# Patient Record
Sex: Male | Born: 1950 | Race: White | Hispanic: No | Smoking: Former smoker
Health system: Southern US, Community
[De-identification: ages and names within clinical notes are randomized; demographics above are authoritative.]

## PROBLEM LIST (undated history)

## (undated) DIAGNOSIS — M1990 Unspecified osteoarthritis, unspecified site: Secondary | ICD-10-CM

## (undated) DIAGNOSIS — C4490 Unspecified malignant neoplasm of skin, unspecified: Secondary | ICD-10-CM

## (undated) DIAGNOSIS — E7800 Pure hypercholesterolemia, unspecified: Secondary | ICD-10-CM

## (undated) DIAGNOSIS — I219 Acute myocardial infarction, unspecified: Secondary | ICD-10-CM

## (undated) HISTORY — DX: Unspecified osteoarthritis, unspecified site: M19.90

## (undated) HISTORY — DX: Unspecified malignant neoplasm of skin, unspecified: C44.90

---

## 1999-05-20 ENCOUNTER — Encounter: Payer: Self-pay | Admitting: Emergency Medicine

## 1999-05-20 ENCOUNTER — Inpatient Hospital Stay (HOSPITAL_COMMUNITY): Admission: EM | Admit: 1999-05-20 | Discharge: 1999-05-31 | Payer: Self-pay | Admitting: Emergency Medicine

## 1999-05-21 ENCOUNTER — Encounter: Payer: Self-pay | Admitting: Cardiology

## 1999-05-22 ENCOUNTER — Encounter: Payer: Self-pay | Admitting: Cardiology

## 1999-05-23 ENCOUNTER — Encounter: Payer: Self-pay | Admitting: Cardiology

## 1999-05-27 ENCOUNTER — Encounter: Payer: Self-pay | Admitting: Cardiovascular Disease

## 1999-05-28 ENCOUNTER — Encounter: Payer: Self-pay | Admitting: Cardiology

## 1999-06-22 ENCOUNTER — Encounter (HOSPITAL_COMMUNITY): Admission: RE | Admit: 1999-06-22 | Discharge: 1999-09-20 | Payer: Self-pay | Admitting: Cardiology

## 1999-07-09 ENCOUNTER — Ambulatory Visit (HOSPITAL_COMMUNITY): Admission: RE | Admit: 1999-07-09 | Discharge: 1999-07-09 | Payer: Self-pay | Admitting: Cardiology

## 1999-09-20 DIAGNOSIS — I219 Acute myocardial infarction, unspecified: Secondary | ICD-10-CM

## 1999-09-20 HISTORY — PX: ANGIOPLASTY: SHX39

## 1999-09-20 HISTORY — DX: Acute myocardial infarction, unspecified: I21.9

## 2007-09-20 HISTORY — PX: KNEE ARTHROSCOPY: SUR90

## 2009-12-21 ENCOUNTER — Encounter: Admission: RE | Admit: 2009-12-21 | Discharge: 2009-12-21 | Payer: Self-pay | Admitting: Cardiology

## 2011-07-06 ENCOUNTER — Other Ambulatory Visit: Payer: Self-pay

## 2011-07-06 ENCOUNTER — Ambulatory Visit (INDEPENDENT_AMBULATORY_CARE_PROVIDER_SITE_OTHER): Payer: Managed Care, Other (non HMO) | Admitting: Vascular Surgery

## 2011-07-06 DIAGNOSIS — I6529 Occlusion and stenosis of unspecified carotid artery: Secondary | ICD-10-CM

## 2011-07-06 DIAGNOSIS — G458 Other transient cerebral ischemic attacks and related syndromes: Secondary | ICD-10-CM

## 2011-07-06 DIAGNOSIS — R0989 Other specified symptoms and signs involving the circulatory and respiratory systems: Secondary | ICD-10-CM

## 2011-07-11 NOTE — Procedures (Unsigned)
CAROTID DUPLEX EXAM  INDICATION:  Subclavian steal syndrome, diminished pulses.  HISTORY: Diabetes:  No. Cardiac:  MI, CAD, stent. Hypertension:  Yes. Smoking:  Currently. Previous Surgery:  No carotid intervention. CV History:  Asymptomatic. Amaurosis Fugax No, Paresthesias No, Hemiparesis No.                                      RIGHT             LEFT Brachial systolic pressure:         120               98 Brachial Doppler waveforms:         WNL               Monophasic Vertebral direction of flow:        Antegrade         Retrograde DUPLEX VELOCITIES (cm/sec) CCA peak systolic                   99                106 ECA peak systolic                   104               107 ICA peak systolic                   51                88 ICA end diastolic                   14                14 PLAQUE MORPHOLOGY:                  Heterogenous      Heterogenous PLAQUE AMOUNT:                      Mild              Mild PLAQUE LOCATION:                    CCA/ICA           CCA/ICA  IMPRESSION: 1. Bilateral internal carotid artery stenosis in the 1% to 39% range. 2. Left internal carotid artery is very tortuous in the mid segment. 3. Left vertebral artery is antegrade with monophasic subclavian     artery and a difference in blood pressures suggestive of subclavian     steal syndrome.  ___________________________________________ Quita Skye Hart Rochester, M.D.  SH/MEDQ  D:  07/06/2011  T:  07/06/2011  Job:  [id number]

## 2011-07-13 ENCOUNTER — Other Ambulatory Visit: Payer: Self-pay

## 2011-07-28 ENCOUNTER — Telehealth: Payer: Self-pay | Admitting: *Deleted

## 2011-07-28 NOTE — Telephone Encounter (Signed)
Dr. Geoffery Lyons office Silva Bandy [phone]) had called Okey Regal re: this patient's need to see Dr. Hart Rochester following his Carotid Duplex on 07/06/11. Dr. Hart Rochester reviewed this scan and said he did not need to see this patient if Mr. Ferencz was asymptomatic. I called Mr Fariss and he said he was having no problems and " wanted to know why we had cost him $500 to investigate nothing." I called Dr. Geoffery Lyons office back and informed them that this patient could just follow up with them because he was asymptomatic at this time ( Scan showed Left verterbral artery is antegrade with monophasic subclavian artery and a difference in blood pressures suggestive of subclavian steal syndrome). Dr. Duaine Dredge will follow this patient and refer him back if he has any need.

## 2011-09-09 ENCOUNTER — Emergency Department (HOSPITAL_COMMUNITY): Payer: Managed Care, Other (non HMO)

## 2011-09-09 ENCOUNTER — Emergency Department (HOSPITAL_COMMUNITY)
Admission: EM | Admit: 2011-09-09 | Discharge: 2011-09-09 | Disposition: A | Discharge status: Home / Self Care | Payer: Managed Care, Other (non HMO) | Attending: Emergency Medicine | Admitting: Emergency Medicine

## 2011-09-09 ENCOUNTER — Encounter: Payer: Self-pay | Admitting: *Deleted

## 2011-09-09 DIAGNOSIS — R51 Headache: Secondary | ICD-10-CM | POA: Insufficient documentation

## 2011-09-09 DIAGNOSIS — IMO0002 Reserved for concepts with insufficient information to code with codable children: Secondary | ICD-10-CM | POA: Insufficient documentation

## 2011-09-09 DIAGNOSIS — W19XXXA Unspecified fall, initial encounter: Secondary | ICD-10-CM

## 2011-09-09 DIAGNOSIS — S0181XA Laceration without foreign body of other part of head, initial encounter: Secondary | ICD-10-CM

## 2011-09-09 DIAGNOSIS — Z79899 Other long term (current) drug therapy: Secondary | ICD-10-CM | POA: Insufficient documentation

## 2011-09-09 DIAGNOSIS — S0180XA Unspecified open wound of other part of head, initial encounter: Secondary | ICD-10-CM | POA: Insufficient documentation

## 2011-09-09 DIAGNOSIS — S01409A Unspecified open wound of unspecified cheek and temporomandibular area, initial encounter: Secondary | ICD-10-CM | POA: Insufficient documentation

## 2011-09-09 DIAGNOSIS — I252 Old myocardial infarction: Secondary | ICD-10-CM | POA: Insufficient documentation

## 2011-09-09 DIAGNOSIS — W108XXA Fall (on) (from) other stairs and steps, initial encounter: Secondary | ICD-10-CM | POA: Insufficient documentation

## 2011-09-09 DIAGNOSIS — Z7982 Long term (current) use of aspirin: Secondary | ICD-10-CM | POA: Insufficient documentation

## 2011-09-09 DIAGNOSIS — S0003XA Contusion of scalp, initial encounter: Secondary | ICD-10-CM | POA: Insufficient documentation

## 2011-09-09 HISTORY — DX: Acute myocardial infarction, unspecified: I21.9

## 2011-09-09 MED ORDER — OXYCODONE-ACETAMINOPHEN 5-325 MG PO TABS: 1.0000 | ORAL_TABLET | Freq: Four times a day (QID) | ORAL | Status: AC | PRN

## 2011-09-09 NOTE — ED Notes (Signed)
Patient back from CT.

## 2011-09-09 NOTE — ED Notes (Signed)
Pt reports falling while walking down the stairs.  Denies LOC.  Pt noted to have 3 lacerations on his face.  No acute distress noted.  Bleeding controlled.
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
## 2012-01-03 ENCOUNTER — Other Ambulatory Visit: Payer: Self-pay | Admitting: Vascular Surgery

## 2012-03-02 ENCOUNTER — Ambulatory Visit: Payer: 59

## 2012-03-02 ENCOUNTER — Ambulatory Visit (INDEPENDENT_AMBULATORY_CARE_PROVIDER_SITE_OTHER): Payer: 59 | Admitting: Family Medicine

## 2012-03-02 VITALS — BP 108/68 | HR 83 | Temp 97.8°F | Resp 16

## 2012-03-02 DIAGNOSIS — S59909A Unspecified injury of unspecified elbow, initial encounter: Secondary | ICD-10-CM

## 2012-03-02 DIAGNOSIS — S6990XA Unspecified injury of unspecified wrist, hand and finger(s), initial encounter: Secondary | ICD-10-CM

## 2012-03-02 DIAGNOSIS — W19XXXA Unspecified fall, initial encounter: Secondary | ICD-10-CM

## 2012-03-02 DIAGNOSIS — M25529 Pain in unspecified elbow: Secondary | ICD-10-CM

## 2012-03-02 MED ORDER — HYDROCODONE-ACETAMINOPHEN 5-500 MG PO TABS: 1.0000 | ORAL_TABLET | Freq: Three times a day (TID) | ORAL | Status: AC | PRN

## 2012-03-02 MED ORDER — AMOXICILLIN-POT CLAVULANATE 875-125 MG PO TABS: 1.0000 | ORAL_TABLET | Freq: Two times a day (BID) | ORAL | Status: AC

## 2012-03-02 NOTE — Progress Notes (Signed)
Patient Name: Caleb Le Date of Birth: 25-Nov-1950 Medical Record Number: [id number] Gender: male Date of Encounter: 03/02/2012  History of Present Illness:  Caleb Le is a 61 y.o. very pleasant male patient who presents with the following:  Here today to evaluate injuries from a fall.  He was on a ladder and fell about 10 feet earlier today.  He hurt his right elbow, and scraped his left forearm.  He denies any headache or head/ neck injury, and states that he does not think he is hurt except for his elbow which has a laceration/ skin tear and tenderness.  He had no LOC, nausea, vomiting or confusion.   Last tetanus shot was last year  Otherwise history of MI and tobacco abuse  There is no problem list on file for this patient.  Past Medical History  Diagnosis Date  . MI (myocardial infarction)    No past surgical history on file. History  Substance Use Topics  . Smoking status: Current Everyday Smoker  . Smokeless tobacco: Not on file  . Alcohol Use: Yes   No family history on file. No Known Allergies  Medication list has been reviewed and updated.  Prior to Admission medications   Medication Sig Start Date End Date Taking? Authorizing Provider  aspirin 81 MG chewable tablet Chew 81 mg by mouth daily.     Yes Historical Provider, MD  citalopram (CELEXA) 20 MG tablet Take 20 mg by mouth daily.     Yes Historical Provider, MD  metoprolol succinate (TOPROL-XL) 25 MG 24 hr tablet Take 25 mg by mouth daily.     Yes Historical Provider, MD  sertraline (ZOLOFT) 50 MG tablet Take 50 mg by mouth daily.      Historical Provider, MD  varenicline (CHANTIX) 1 MG tablet Take 1 mg by mouth 2 (two) times daily.      Historical Provider, MD    Review of Systems:  As per HPI- otherwise negative.  Physical Examination: Filed Vitals:   03/02/12 1518  BP: 108/68  Pulse: 83  Temp: 97.8 F (36.6 C)  Resp: 16   There were no vitals filed for this visit. There is  no height or weight on file to calculate BMI. Ideal Body Weight:    GEN: WDWN, NAD, Non-toxic, A & O x 3 HEENT: Atraumatic, Normocephalic. Neck supple. No masses, No LAD. PEERL, EOMI.  No tenderness with palpation of cervical spine, neck with full ROM.  TM and oropharynx wnl except evidence of smoking on tongue.  No "raccoon eyes," tenderness along facial bones or other sign of fracture.  He does have a wound on the left bridge of nose, but he states that this is chronic and he will see derm for possible skin cancer later this month.  Ears and Nose: No external deformity. CV: RRR, No M/G/R. No JVD. No thrill. No extra heart sounds. PULM: CTA B, no wheezes, crackles, rhonchi. No retractions. No resp. distress. No accessory muscle use. ABD: S, NT, ND EXTR: No c/c/e There is an abrasion over the left forearm, no evidence of fracture.  There is a small laceration in the web-space of the right hand.  Right elbow: minimal tenderness to ROM, there is a skin tear and a deeper defect in the soft tissue over the lateral elbow joint.  He is able to flex and extend the elbow fully, and to pronate and supinate.  NEURO Normal gait.  PSYCH: Normally interactive. Conversant. Not depressed or  anxious appearing.    [facility] reading (PRIMARY) by  Dr. Patsy Lager. Right elbow:  No fracture.  Small calcification anteriorly in soft tissue, but the wound is posterior/ lateral C- spine: negative except for degenative change  CERVICAL SPINE - COMPLETE 4+ VIEW  Comparison: No priors.  Findings: Multiple views of the cervical spine demonstrate no acute  displaced cervical spine fracture. Alignment is anatomic.  Prevertebral soft tissues are normal. There is mild multilevel  degenerative disc disease, most severe at C6-C7, and C7-T1. Very  mild multilevel facet arthropathy is also noted. Visualized  portions of the upper thorax are remarkable for atherosclerotic  calcifications within the arch of the aorta.  IMPRESSION:    1. No acute radiographic abnormality of the cervical spine.  2. Mild multilevel degenerative disc disease and cervical  spondylosis, as above.  3. Atherosclerosis.   RIGHT ELBOW - COMPLETE 3+ VIEW  Comparison: No priors.  Findings: Multiple views of the right elbow demonstrate no acute  fracture, subluxation, dislocation, joint or soft tissue  abnormality.  IMPRESSION:  1. No acute radiographic abnormality of the right elbow.  Assessment and Plan: 1. Fall  DG Cervical Spine Complete  2. Pain, elbow  DG Elbow Complete Right  3. Injury of elbow  amoxicillin-clavulanate (AUGMENTIN) 875-125 MG per tablet, HYDROcodone-acetaminophen (VICODIN) 5-500 MG per tablet   elbow and hand wound repaired as per note by Kennedy Bucker, PA- C.   Will cover with augmentin and give some vicodin to use for pain.  Cautioned re: signs and symptoms of infection.  He is instructed to RTC for a wound check in 2 or 3 days, and also cautioned regarding signs of other more serious injury including HA, N/V, confusion.    Abbe Amsterdam, MD

## 2012-03-02 NOTE — Progress Notes (Signed)
Procedure:  VCO  Right Elbow Wound  Lidocaine 2% plain injected locally 4 cc. Sterile field and prep.  Retracted superficial flapped skin debrided.   Wound explored.  Muscle visualized without deficit. No FB noted. Wound irrigated with 20 cc sterile NACL. Wound closed with # 2 Sub Q, 4.0 Vicryl and # 2 HM 5.0 Ethilon Wound cleansed and dressed.  Right hand, 1st/2nd webspace  Lidocaine 2% plain, injected locally 3 cc. Sterile field and prep. Explored and not deep. Closed # 2 HM, 5.0 Ethilon. Wound cleansed and dressed.

## 2012-03-02 NOTE — Patient Instructions (Addendum)

## 2012-03-12 ENCOUNTER — Other Ambulatory Visit: Payer: Self-pay | Admitting: Family Medicine

## 2012-03-12 ENCOUNTER — Ambulatory Visit
Admission: RE | Admit: 2012-03-12 | Discharge: 2012-03-12 | Disposition: A | Discharge status: Home / Self Care | Payer: 59 | Source: Ambulatory Visit | Attending: Family Medicine | Admitting: Family Medicine

## 2012-03-12 DIAGNOSIS — R609 Edema, unspecified: Secondary | ICD-10-CM

## 2012-03-12 DIAGNOSIS — R52 Pain, unspecified: Secondary | ICD-10-CM

## 2012-06-13 ENCOUNTER — Encounter (HOSPITAL_BASED_OUTPATIENT_CLINIC_OR_DEPARTMENT_OTHER): Payer: Self-pay | Admitting: Emergency Medicine

## 2012-06-13 ENCOUNTER — Emergency Department (HOSPITAL_BASED_OUTPATIENT_CLINIC_OR_DEPARTMENT_OTHER): Payer: 59

## 2012-06-13 ENCOUNTER — Emergency Department (HOSPITAL_BASED_OUTPATIENT_CLINIC_OR_DEPARTMENT_OTHER)
Admission: EM | Admit: 2012-06-13 | Discharge: 2012-06-13 | Disposition: A | Discharge status: Home / Self Care | Payer: 59 | Attending: Emergency Medicine | Admitting: Emergency Medicine

## 2012-06-13 DIAGNOSIS — I252 Old myocardial infarction: Secondary | ICD-10-CM | POA: Insufficient documentation

## 2012-06-13 DIAGNOSIS — R51 Headache: Secondary | ICD-10-CM | POA: Insufficient documentation

## 2012-06-13 DIAGNOSIS — S0181XA Laceration without foreign body of other part of head, initial encounter: Secondary | ICD-10-CM

## 2012-06-13 DIAGNOSIS — S0180XA Unspecified open wound of other part of head, initial encounter: Secondary | ICD-10-CM | POA: Insufficient documentation

## 2012-06-13 DIAGNOSIS — R079 Chest pain, unspecified: Secondary | ICD-10-CM | POA: Insufficient documentation

## 2012-06-13 DIAGNOSIS — M47812 Spondylosis without myelopathy or radiculopathy, cervical region: Secondary | ICD-10-CM | POA: Insufficient documentation

## 2012-06-13 DIAGNOSIS — H05239 Hemorrhage of unspecified orbit: Secondary | ICD-10-CM | POA: Insufficient documentation

## 2012-06-13 DIAGNOSIS — M25559 Pain in unspecified hip: Secondary | ICD-10-CM | POA: Insufficient documentation

## 2012-06-13 HISTORY — DX: Pure hypercholesterolemia, unspecified: E78.00

## 2012-06-13 MED ORDER — LIDOCAINE-EPINEPHRINE 2 %-1:100000 IJ SOLN
INTRAMUSCULAR | Status: AC
  Filled 2012-06-13: qty 1
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
## 2012-06-13 NOTE — ED Notes (Signed)
Pt intoxicated upon arrival, reports drinking large quantities of alcholol tonight

## 2012-09-19 DIAGNOSIS — C4490 Unspecified malignant neoplasm of skin, unspecified: Secondary | ICD-10-CM

## 2012-09-19 HISTORY — DX: Unspecified malignant neoplasm of skin, unspecified: C44.90

## 2012-09-19 HISTORY — PX: OTHER SURGICAL HISTORY: SHX169

## 2013-02-01 ENCOUNTER — Other Ambulatory Visit (INDEPENDENT_AMBULATORY_CARE_PROVIDER_SITE_OTHER): Payer: 59 | Admitting: Vascular Surgery

## 2013-02-01 ENCOUNTER — Encounter: Payer: Self-pay | Admitting: Internal Medicine

## 2013-02-01 DIAGNOSIS — I6529 Occlusion and stenosis of unspecified carotid artery: Secondary | ICD-10-CM

## 2013-03-21 ENCOUNTER — Ambulatory Visit (AMBULATORY_SURGERY_CENTER): Payer: 59 | Admitting: *Deleted

## 2013-03-21 VITALS — Ht 67.0 in | Wt 164.8 lb

## 2013-03-21 DIAGNOSIS — Z1211 Encounter for screening for malignant neoplasm of colon: Secondary | ICD-10-CM

## 2013-03-21 MED ORDER — MOVIPREP 100 G PO SOLR: ORAL | Status: DC

## 2013-04-04 ENCOUNTER — Encounter: Payer: Self-pay | Admitting: Internal Medicine

## 2013-04-04 ENCOUNTER — Ambulatory Visit (AMBULATORY_SURGERY_CENTER): Payer: 59 | Admitting: Internal Medicine

## 2013-04-04 VITALS — BP 106/64 | HR 68 | Temp 97.5°F | Resp 22 | Ht 67.0 in | Wt 164.0 lb

## 2013-04-04 DIAGNOSIS — Z1211 Encounter for screening for malignant neoplasm of colon: Secondary | ICD-10-CM

## 2013-04-04 DIAGNOSIS — D126 Benign neoplasm of colon, unspecified: Secondary | ICD-10-CM

## 2013-04-04 MED ORDER — SODIUM CHLORIDE 0.9 % IV SOLN: 500.0000 mL | INTRAVENOUS | Status: DC

## 2013-04-04 NOTE — Progress Notes (Signed)
Lidocaine-40mg IV prior to Propofol InductionPropofol given over incremental dosages 

## 2013-04-04 NOTE — Op Note (Signed)
[facility] [address] [place], [address]   COLONOSCOPY PROCEDURE REPORT  PATIENT: Caleb Le, Caleb Le  MR#: [id number] BIRTHDATE: 04-11-1951 , 61  yrs. old GENDER: Male ENDOSCOPIST: Beverley Fiedler, MD REFERRED VH:QIONG Duaine Dredge, M.D. PROCEDURE DATE:  04/04/2013 PROCEDURE:   Colonoscopy with snare polypectomy ASA CLASS:   Class II INDICATIONS:average risk screening and first colonoscopy (pt report 1st colonoscopy, reported hx of polyps felt to be bladder polyps).  MEDICATIONS: MAC sedation, administered by CRNA and propofol (Diprivan) 450mg  IV  DESCRIPTION OF PROCEDURE:   After the risks benefits and alternatives of the procedure were thoroughly explained, informed consent was obtained.  A digital rectal exam revealed no rectal mass.   The LB [id number]  endoscope was introduced through the anus and advanced to the cecum, which was identified by both the appendix and ileocecal valve. No adverse events experienced. The quality of the prep was good, using MoviPrep  The instrument was then slowly withdrawn as the colon was fully examined.  COLON FINDINGS: A sessile polyp measuring 5 mm in size was found at the cecum.  A polypectomy was performed with a cold snare.  The resection was complete and the polyp tissue was completely retrieved.   A sessile polyp measuring 7 mm in size with a mucous cap was found in the transverse colon.  A polypectomy was performed using snare cautery.  The resection was complete and the polyp tissue was completely retrieved.   Two sessile polyps measuring 3 and 5 mm in size were found in the sigmoid colon and rectosigmoid colon.  Polypectomy was performed using cold snare.  All resections were complete and all polyp tissue was completely retrieved. Retroflexed views revealed internal hemorrhoids. The time to cecum=3 minutes 55 seconds.  Withdrawal time=21 minutes 15 seconds. The scope was withdrawn and the procedure  completed. COMPLICATIONS: There were no complications.  ENDOSCOPIC IMPRESSION: 1.   Sessile polyp measuring 5 mm in size was found at the cecum; polypectomy was performed with a cold snare 2.   Sessile polyp measuring 7 mm in size was found in the transverse colon; polypectomy was performed using snare cautery 3.   Two sessile polyps measuring 3 and 5 mm in size were found in the sigmoid colon and rectosigmoid colon; Polypectomy was performed using cold snare 4.   Small internal hemorrhoids  RECOMMENDATIONS: 1.  Hold aspirin, aspirin products, and anti-inflammatory medication for 2 weeks. 2.  Await pathology results 3.  If the polyps removed today are proven to be adenomatous (pre-cancerous) polyps, you will need a colonoscopy in 3 years. Otherwise you should continue to follow colorectal cancer screening guidelines for "routine risk" patients with a colonoscopy in 10 years.  You will receive a letter within 1-2 weeks with the results of your biopsy as well as final recommendations.  Please call my office if you have not received a letter after 3 weeks.   eSigned:  Beverley Fiedler, MD 04/04/2013 10:15 AM   cc: The Patient and Mosetta Putt, MD   PATIENT NAME:  Caleb Le, Caleb Le MR#: [id number]

## 2013-04-04 NOTE — Progress Notes (Signed)
Patient did not experience any of the following events: a burn prior to discharge; a fall within the facility; wrong site/side/patient/procedure/implant event; or a hospital transfer or hospital admission upon discharge from the facility. (G8907) Patient did not have preoperative order for IV antibiotic SSI prophylaxis. (G8918)  

## 2013-04-04 NOTE — Patient Instructions (Addendum)
YOU HAD AN ENDOSCOPIC PROCEDURE TODAY AT THE [facility]: Refer to the procedure report that was given to you for any specific questions about what was found during the examination.  If the procedure report does not answer your questions, please call your gastroenterologist to clarify.  If you requested that your care partner not be given the details of your procedure findings, then the procedure report has been included in a sealed envelope for you to review at your convenience later.  YOU SHOULD EXPECT: Some feelings of bloating in the abdomen. Passage of more gas than usual.  Walking can help get rid of the air that was put into your GI tract during the procedure and reduce the bloating. If you had a lower endoscopy (such as a colonoscopy or flexible sigmoidoscopy) you may notice spotting of blood in your stool or on the toilet paper. If you underwent a bowel prep for your procedure, then you may not have a normal bowel movement for a few days.  DIET: Your first meal following the procedure should be a light meal and then it is ok to progress to your normal diet.  A half-sandwich or bowl of soup is an example of a good first meal.  Heavy or fried foods are harder to digest and may make you feel nauseous or bloated.  Likewise meals heavy in dairy and vegetables can cause extra gas to form and this can also increase the bloating.  Drink plenty of fluids but you should avoid alcoholic beverages for 24 hours.  ACTIVITY: Your care partner should take you home directly after the procedure.  You should plan to take it easy, moving slowly for the rest of the day.  You can resume normal activity the day after the procedure however you should NOT DRIVE or use heavy machinery for 24 hours (because of the sedation medicines used during the test).    SYMPTOMS TO REPORT IMMEDIATELY: A gastroenterologist can be reached at any hour.  During normal business hours, 8:30 AM to 5:00 PM Monday through Friday,  call [phone].  After hours and on weekends, please call the GI answering service at [phone] who will take a message and have the physician on call contact you.   Following lower endoscopy (colonoscopy or flexible sigmoidoscopy):  Excessive amounts of blood in the stool  Significant tenderness or worsening of abdominal pains  Swelling of the abdomen that is new, acute  Fever of 100F or higher  FOLLOW UP: If any biopsies were taken you will be contacted by phone or by letter within the next 1-3 weeks.  Call your gastroenterologist if you have not heard about the biopsies in 3 weeks.  Our staff will call the home number listed on your records the next business day following your procedure to check on you and address any questions or concerns that you may have at that time regarding the information given to you following your procedure. This is a courtesy call and so if there is no answer at the home number and we have not heard from you through the emergency physician on call, we will assume that you have returned to your regular daily activities without incident.  SIGNATURES/CONFIDENTIALITY: You and/or your care partner have signed paperwork which will be entered into your electronic medical record.  These signatures attest to the fact that that the information above on your After Visit Summary has been reviewed and is understood.  Full responsibility of the confidentiality of this   discharge information lies with you and/or your care-partner.  Polyp and hemorrhoid information given.    Avoid aspirin, aspirin products, and non -steroidal anti-inflammatory meds for 2 weeks.  Dr.  Rhea Belton will let you know about timing of next colonoscopy after pathology report is reviewed.  He will send you a letter to advise,

## 2013-04-04 NOTE — Progress Notes (Signed)
Called to room to assist during endoscopic procedure.  Patient ID and intended procedure confirmed with present staff. Received instructions for my participation in the procedure from the performing physician.  

## 2013-04-05 ENCOUNTER — Telehealth: Payer: Self-pay | Admitting: *Deleted

## 2013-04-05 NOTE — Telephone Encounter (Signed)
  Follow up Call-  Call back number 04/04/2013  Permission to leave phone message Yes     Patient questions:  Do you have a fever, pain , or abdominal swelling? no Pain Score  0 *  Have you tolerated food without any problems? yes  Have you been able to return to your normal activities? yes  Do you have any questions about your discharge instructions: Diet   no Medications  no Follow up visit  no  Do you have questions or concerns about your Care? no  Actions: * If pain score is 4 or above: No action needed, pain <4.

## 2013-04-09 ENCOUNTER — Encounter: Payer: Self-pay | Admitting: Internal Medicine

## 2014-03-24 ENCOUNTER — Other Ambulatory Visit: Payer: Self-pay | Admitting: Family Medicine

## 2014-03-24 ENCOUNTER — Ambulatory Visit
Admission: RE | Admit: 2014-03-24 | Discharge: 2014-03-24 | Disposition: A | Discharge status: Home / Self Care | Payer: BC Managed Care – PPO | Source: Ambulatory Visit | Attending: Family Medicine | Admitting: Family Medicine

## 2014-03-24 DIAGNOSIS — R053 Chronic cough: Secondary | ICD-10-CM

## 2014-03-24 DIAGNOSIS — R05 Cough: Secondary | ICD-10-CM

## 2014-03-26 ENCOUNTER — Other Ambulatory Visit (HOSPITAL_COMMUNITY): Payer: Self-pay | Admitting: Family Medicine

## 2014-03-26 ENCOUNTER — Ambulatory Visit (HOSPITAL_COMMUNITY)
Admission: RE | Admit: 2014-03-26 | Discharge: 2014-03-26 | Disposition: A | Discharge status: Home / Self Care | Payer: BC Managed Care – PPO | Source: Ambulatory Visit | Attending: Vascular Surgery | Admitting: Vascular Surgery

## 2014-03-26 DIAGNOSIS — I6529 Occlusion and stenosis of unspecified carotid artery: Secondary | ICD-10-CM
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
## 2016-06-17 IMAGING — CR DG CHEST 2V
2 series · 2 of 2 positions shown · non-contrast
Comparison: 06/13/2012 at [HOSPITAL] [REDACTED]

CLINICAL DATA: Chronic cough, shortness of breath

EXAM:
CHEST  2 VIEW

[view not recorded (1 of 2)]
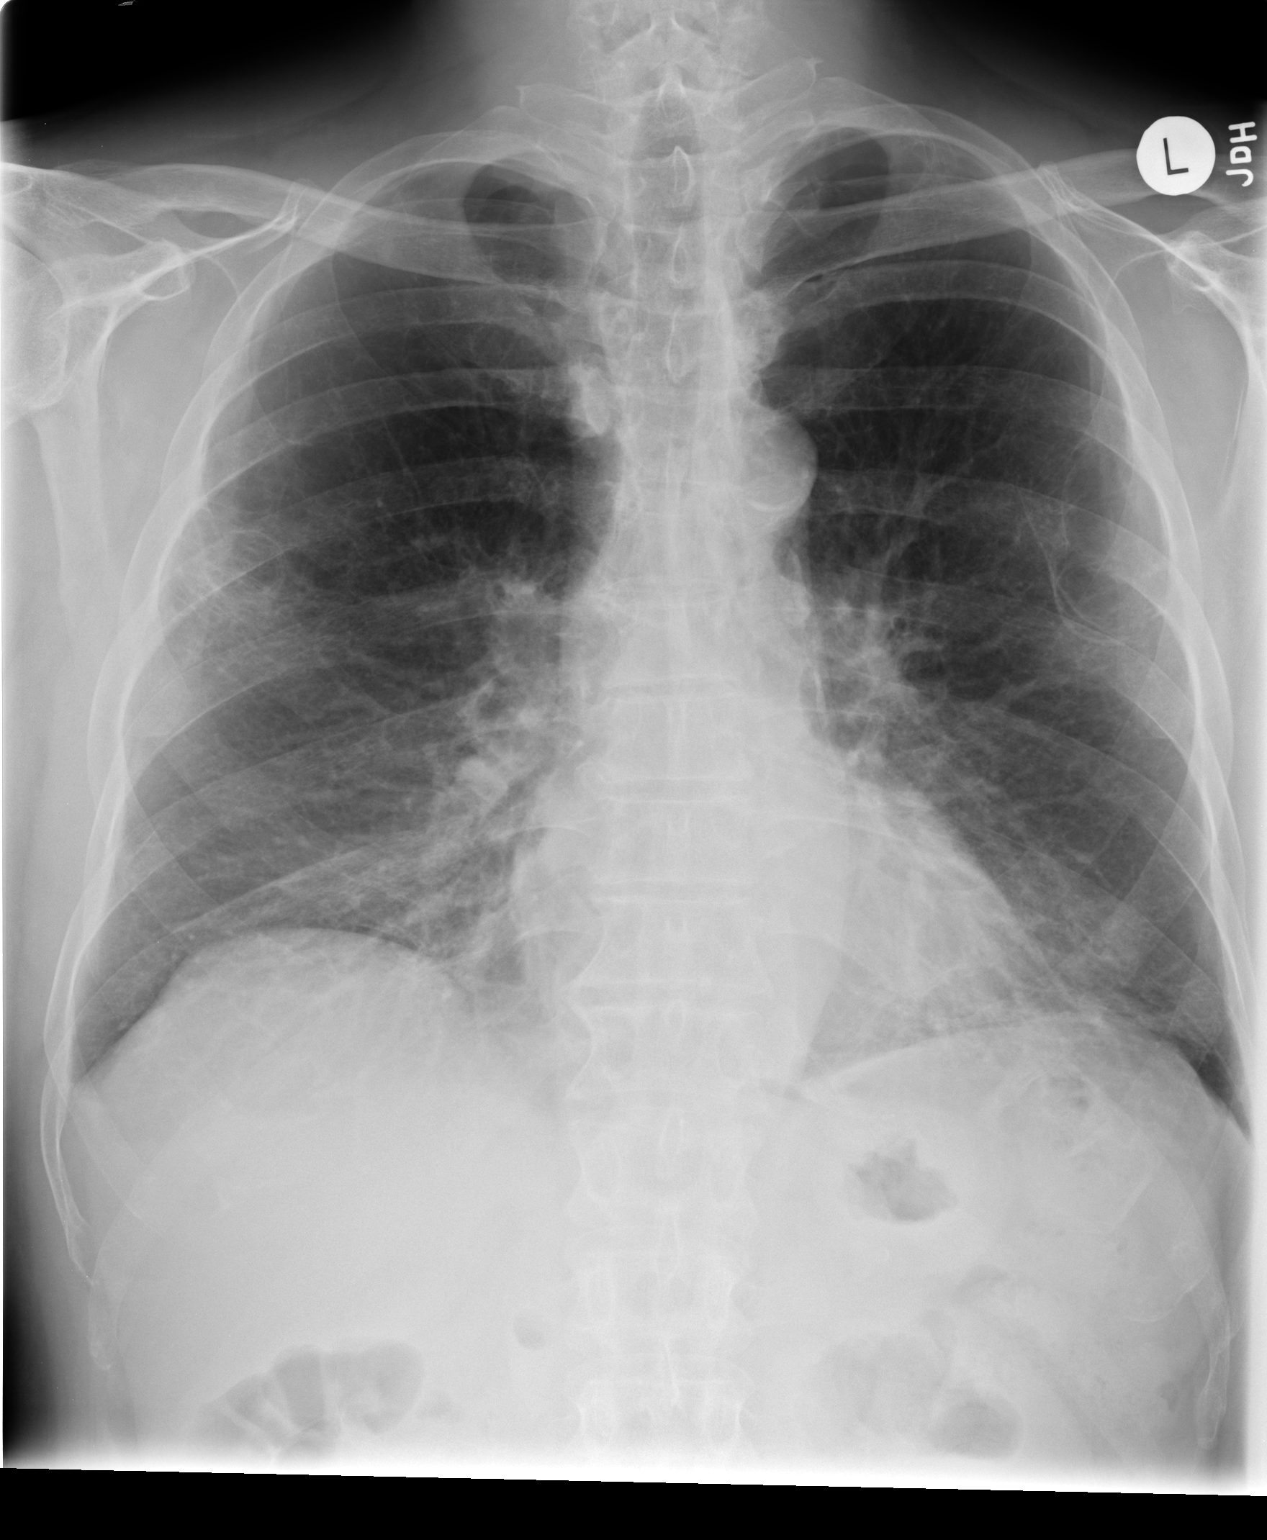

[view not recorded (2 of 2)]
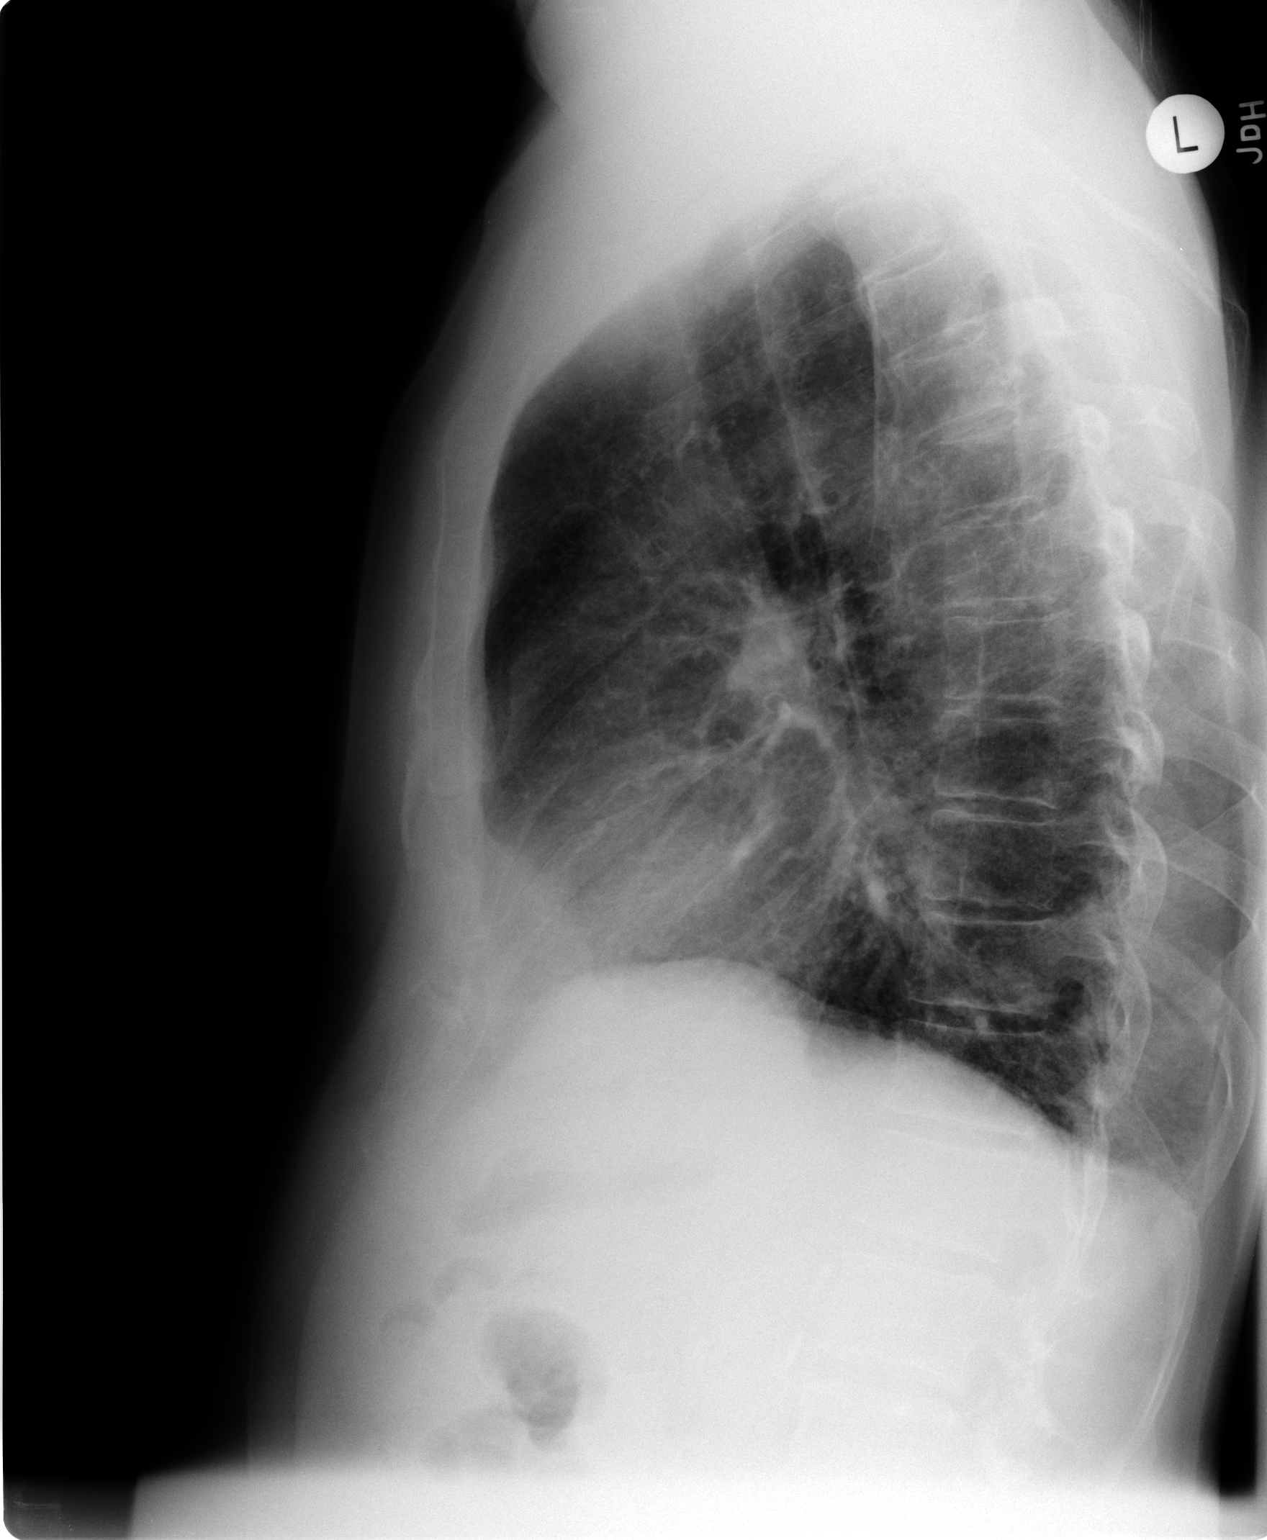

[2 of 2 positions shown; findings below may reference images not displayed]

FINDINGS: The heart size and mediastinal contours are within normal limits.
Both lungs are clear with the exception of bullous emphysematous
change re- identified. The visualized skeletal structures are
unremarkable. Minimal hypoaeration on the frontal projection is
identified with crowding of the bronchovascular markings at the lung
bases.
IMPRESSION: No active cardiopulmonary disease.  Bullous emphysema reidentified.

## 2018-02-09 ENCOUNTER — Encounter: Payer: Self-pay | Admitting: Internal Medicine

## 2019-11-10 ENCOUNTER — Ambulatory Visit: Payer: Medicare Other | Attending: Internal Medicine

## 2019-11-10 DIAGNOSIS — Z23 Encounter for immunization: Secondary | ICD-10-CM | POA: Insufficient documentation

## 2019-11-10 NOTE — Progress Notes (Signed)
   Covid-19 Vaccination Clinic  Name:  FIELDER GENGLER    MRN: [id number] DOB: [birth year]  11/10/2019  Mr. Countess was observed post Covid-19 immunization for 15 minutes without incidence. He was provided with Vaccine Information Sheet and instruction to access the V-Safe system.   Mr. Ricarte was instructed to call 911 with any severe reactions post vaccine: Marland Kitchen Difficulty breathing  . Swelling of your face and throat  . A fast heartbeat  . A bad rash all over your body  . Dizziness and weakness    Immunizations Administered    Name Date Dose VIS Date Route   Pfizer COVID-19 Vaccine 11/10/2019 10:33 AM 0.3 mL 08/30/2019 Intramuscular   Manufacturer: [place]   Lot: [id number]   [place]: [id number]

## 2019-12-04 ENCOUNTER — Ambulatory Visit: Payer: Medicare Other | Attending: Internal Medicine

## 2019-12-04 DIAGNOSIS — Z23 Encounter for immunization: Secondary | ICD-10-CM

## 2019-12-04 NOTE — Progress Notes (Signed)
   Covid-19 Vaccination Clinic  Name:  Caleb Le    MRN: [id number] DOB: 26-Jun-1951  12/04/2019  [name] was observed post Covid-19 immunization for 15 minutes without incident. He was provided with Vaccine Information Sheet and instruction to access the V-Safe system.   [name] was instructed to call 911 with any severe reactions post vaccine: Marland Kitchen Difficulty breathing  . Swelling of face and throat  . A fast heartbeat  . A bad rash all over body  . Dizziness and weakness   Immunizations Administered    Name Date Dose VIS Date Route   Pfizer COVID-19 Vaccine 12/04/2019 10:18 AM 0.3 mL 08/30/2019 Intramuscular   Manufacturer: [place]   Lot: [id number]   [place]: [id number]

## 2020-09-04 ENCOUNTER — Ambulatory Visit: Payer: Medicare Other | Attending: Internal Medicine

## 2020-09-04 DIAGNOSIS — Z23 Encounter for immunization: Secondary | ICD-10-CM

## 2020-09-04 NOTE — Progress Notes (Signed)
   Covid-19 Vaccination Clinic  Name:  Caleb Le    MRN: [id number] DOB: 13-May-1951  09/04/2020  [name] was observed post Covid-19 immunization for 15 minutes without incident. He was provided with Vaccine Information Sheet and instruction to access the V-Safe system.   [name] was instructed to call 911 with any severe reactions post vaccine: Marland Kitchen Difficulty breathing  . Swelling of face and throat  . A fast heartbeat  . A bad rash all over body  . Dizziness and weakness   Immunizations Administered    Name Date Dose VIS Date Route   Pfizer COVID-19 Vaccine 09/04/2020  3:32 PM 0.3 mL 07/08/2020 Intramuscular   Manufacturer: [place]   Lot: [id number]   [place]: [id number]

## 2022-10-27 DIAGNOSIS — Z85828 Personal history of other malignant neoplasm of skin: Secondary | ICD-10-CM | POA: Diagnosis not present

## 2022-10-27 DIAGNOSIS — L821 Other seborrheic keratosis: Secondary | ICD-10-CM | POA: Diagnosis not present

## 2022-10-27 DIAGNOSIS — L814 Other melanin hyperpigmentation: Secondary | ICD-10-CM | POA: Diagnosis not present

## 2022-10-27 DIAGNOSIS — Z08 Encounter for follow-up examination after completed treatment for malignant neoplasm: Secondary | ICD-10-CM | POA: Diagnosis not present

## 2022-10-27 DIAGNOSIS — D1801 Hemangioma of skin and subcutaneous tissue: Secondary | ICD-10-CM | POA: Diagnosis not present

## 2022-10-27 DIAGNOSIS — D485 Neoplasm of uncertain behavior of skin: Secondary | ICD-10-CM | POA: Diagnosis not present

## 2022-10-31 ENCOUNTER — Ambulatory Visit: Payer: Self-pay

## 2022-10-31 ENCOUNTER — Ambulatory Visit: Payer: Self-pay | Admitting: *Deleted

## 2022-10-31 NOTE — Telephone Encounter (Signed)
Received call from [place], pt's wife. She is not on DPR. She states that pt has a hernia and it swells. She also reports that pt does not urinate very often when [place]  [place].  Tried to call pt for a 3 way conversation. Call went directly to voice mail. Left message on machine.  Wife states that she will call pt and have him call us back.

## 2022-10-31 NOTE — Telephone Encounter (Signed)
hernia   PPer agent: "Pt has a new pt appt 4/5 but has a hernia and needs to see a surgeon asap.  CB#  [phone]"     Chief Complaint: "Hernia" Symptoms: Reports swelling right groin, comes and goes.Gets larger with walking, straining, "Little smaller than a tennis ball." States lies down and eventually resolves. States when swollen, difficulty starting urine stream.  Frequency: Noted 60-90 days ago. Pertinent Negatives: Patient denies pain. Disposition: []$ ED /[]$ Urgent Care (no appt availability in office) / [x]$ Appointment(In office/virtual)/ []$  [facility]/ []$ Home Care/ []$ Refused Recommended Disposition /[]$ [place]/ []$  Follow-up with PCP Additional Notes:  Agent secured NP appt in April. Pt requesting earlier appt "My wife has seen Dr. Wynetta Emery for years and I've met her many times."  Care advise provided, pt verbalizes understanding. Advised ED for worsening symptoms.  Please advise. Placed on wait list. Reason for Disposition  [1] New-onset hernia suspected (reducible bulge in groin or abdomen; non-tender) AND [2] NO pain or vomiting  Answer Assessment - Initial Assessment Questions 1. ONSET:  "When did this first appear?"     60-90 days ago, "Getting bigger." 2. APPEARANCE: "What does it look like?"     SCOmes and goes 3. SIZE: "How big is it?" (inches, cm or compare to coins, fruit)     Little smaller than tennis ball 4. LOCATION: "Where exactly is the hernia located?"     Right groin 5. PATTERN: "Does the swelling come and go, or has it been constant since it started?"     Comes and goes, bigger with walking, straining with BM, "Then goes away or gets smaller."  6. PAIN: "Is there any pain?" If Yes, ask: "How bad is it?"  (Scale 1-10; or mild, moderate, severe)    - MILD (1-3): Doesn't interfere with normal activities, abdomen soft and not tender to touch.     - MODERATE (4-7): Interferes with normal activities or awakens from sleep, abdomen tender to  touch.     - SEVERE (8-10): Excruciating pain, doubled over, unable to do any normal activities.       No pain 7. DIAGNOSIS: "Have you been seen by a doctor (or NP/PA) for this?" "Did the doctor diagnose you as having a hernia?"     No 8. OTHER SYMPTOMS: "Do you have any other symptoms?" (e.g., fever, abdomen pain, vomiting)    More difficult to urinate when swollen.  Protocols used: [facility]

## 2022-12-23 ENCOUNTER — Ambulatory Visit: Payer: Medicare Other | Attending: Internal Medicine | Admitting: Internal Medicine

## 2022-12-23 ENCOUNTER — Encounter: Payer: Self-pay | Admitting: Internal Medicine

## 2022-12-23 VITALS — BP 133/68 | HR 66 | Temp 97.9°F | Ht 67.0 in | Wt 144.0 lb

## 2022-12-23 DIAGNOSIS — Z2821 Immunization not carried out because of patient refusal: Secondary | ICD-10-CM | POA: Insufficient documentation

## 2022-12-23 DIAGNOSIS — K5900 Constipation, unspecified: Secondary | ICD-10-CM | POA: Insufficient documentation

## 2022-12-23 DIAGNOSIS — Z1211 Encounter for screening for malignant neoplasm of colon: Secondary | ICD-10-CM

## 2022-12-23 DIAGNOSIS — I2510 Atherosclerotic heart disease of native coronary artery without angina pectoris: Secondary | ICD-10-CM | POA: Diagnosis not present

## 2022-12-23 DIAGNOSIS — Z7982 Long term (current) use of aspirin: Secondary | ICD-10-CM | POA: Insufficient documentation

## 2022-12-23 DIAGNOSIS — K469 Unspecified abdominal hernia without obstruction or gangrene: Secondary | ICD-10-CM | POA: Diagnosis present

## 2022-12-23 DIAGNOSIS — Z85828 Personal history of other malignant neoplasm of skin: Secondary | ICD-10-CM | POA: Insufficient documentation

## 2022-12-23 DIAGNOSIS — I252 Old myocardial infarction: Secondary | ICD-10-CM | POA: Diagnosis not present

## 2022-12-23 DIAGNOSIS — Z79899 Other long term (current) drug therapy: Secondary | ICD-10-CM | POA: Insufficient documentation

## 2022-12-23 DIAGNOSIS — Z951 Presence of aortocoronary bypass graft: Secondary | ICD-10-CM | POA: Insufficient documentation

## 2022-12-23 DIAGNOSIS — F17290 Nicotine dependence, other tobacco product, uncomplicated: Secondary | ICD-10-CM | POA: Insufficient documentation

## 2022-12-23 DIAGNOSIS — K4090 Unilateral inguinal hernia, without obstruction or gangrene, not specified as recurrent: Secondary | ICD-10-CM | POA: Diagnosis not present

## 2022-12-23 DIAGNOSIS — Z1331 Encounter for screening for depression: Secondary | ICD-10-CM

## 2022-12-23 DIAGNOSIS — Z125 Encounter for screening for malignant neoplasm of prostate: Secondary | ICD-10-CM

## 2022-12-23 DIAGNOSIS — Z7689 Persons encountering health services in other specified circumstances: Secondary | ICD-10-CM | POA: Diagnosis not present

## 2022-12-23 DIAGNOSIS — F17200 Nicotine dependence, unspecified, uncomplicated: Secondary | ICD-10-CM | POA: Insufficient documentation

## 2022-12-23 MED ORDER — ATORVASTATIN CALCIUM 20 MG PO TABS: 20.0000 mg | ORAL_TABLET | Freq: Every day | ORAL | 3 refills | Status: DC

## 2022-12-23 MED ORDER — VARENICLINE TARTRATE (STARTER) 0.5 MG X 11 & 1 MG X 42 PO TBPK: ORAL_TABLET | ORAL | 0 refills | Status: DC

## 2022-12-23 MED ORDER — POLYETHYLENE GLYCOL 3350 17 GM/SCOOP PO POWD
17.0000 g | Freq: Every day | ORAL | 1 refills | Status: AC | PRN
Start: 2022-12-23 — End: ?

## 2022-12-23 NOTE — Progress Notes (Signed)
Patient ID: Caleb Le, male    DOB: Aug 20, 1951  MRN: [id number]  CC: Establish Care (Est care / new pt. Caleb Le/Requesting referral to gen surgery for hernia /No to Tdap. No to shingles vax. No to pneumonia vax. )   Subjective: Caleb Le is a 72 y.o. 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  Last seen about 5 yrs ago.  Decided to change because insurance does not cover.  Patient's wife is my patient.  Hx of MI with stent 2013.  Had cardiologist Dr. Arlyn Le but has not seen him in a number of years.   Takes ASA baby daily.  Not on statin or B-blocker.   Very active working out in the yard.  CP occasionally related to stress from his daughter who is a heroin addict.   No SOB Smokes 7-8 small cigars a day; smoked for 10 yrs.  "I like it, it relaxes me." Has tried chewing gum but still smokes.  Drives several hrs a few times a wk to [place]; smoke s during the drive out of boredom.  Son had given him some chantix in past and found it helpful  Has hernia LT groin x 1 yr Has increased in size about size of tennis ball.  Not painful except with coughing Requests refill on MiraLAX which she takes as needed for constipation.  This works well for him.  Patient with mildly positive depression screen: Reports that depression is not a major issue for him.  He just gets upset about his daughter's lifestyle at times.  Blood pressure borderline high.  Reports no issues with blood pressure in the past.   HM: Patient declines pneumonia, Tdap, shingles vaccines. Had c-scope 10 yrs ago; polyps removed.  Due for Medicare wellness visit.  Due for routine blood test today including PSA for prostate cancer screening.    Current Outpatient Medications on File Prior to Visit   Medication Sig Dispense Refill   aspirin 81 MG chewable tablet Chew 81 mg by mouth daily.       amitriptyline (ELAVIL) 10 MG tablet Take 10 mg by mouth as needed for sleep. (Patient not taking: Reported on 12/23/2022)     ascorbic acid (VITAMIN C) 500 MG tablet Take 500 mg by mouth daily. (Patient not taking: Reported on 12/23/2022)     Cholecalciferol (VITAMIN D-3) 1000 UNITS CAPS Take by mouth daily. (Patient not taking: Reported on 12/23/2022)     glucosamine-chondroitin 500-400 MG tablet Take 1 tablet by mouth daily. (Patient not taking: Reported on 12/23/2022)     hydrOXYzine (ATARAX/VISTARIL) 25 MG tablet  (Patient not taking: Reported on 12/23/2022)     metoprolol succinate (TOPROL-XL) 25 MG 24 hr tablet Take 25 mg by mouth daily.   (Patient not taking: Reported on 12/23/2022)     pravastatin (PRAVACHOL) 40 MG tablet  (Patient not taking: Reported on 12/23/2022)     simvastatin (ZOCOR) 40 MG tablet Take 40 mg by mouth every evening. (Patient not taking: Reported on 12/23/2022)     No current facility-administered medications on file prior to visit.    No Known Allergies  Social History   Socioeconomic History   Marital status: Divorced    Spouse name: Not on file   Number of children:  Not on file   Years of education: Not on file   Highest education level: Not on file  Occupational History   Not on file  Tobacco Use   Smoking status: Former    Types: Cigarettes    Quit date: 10/20/2012    Years since quitting: 10.1   Smokeless tobacco: Never  Vaping Use   Vaping Use: Never used  Substance and Sexual Activity   Alcohol use: No    Comment: stopped drinking alcohol November 2013   Drug use: No    Types: Marijuana   Sexual activity: Not on file  Other Topics Concern   Not on file  Social History Narrative   Not on file   Social Determinants of Health   Financial Resource Strain: Not on file  Food Insecurity: Not on file  Transportation Needs: Not on file  Physical Activity: Not  on file  Stress: Not on file  Social Connections: Not on file  Intimate Partner Violence: Not on file    Family History  Problem Relation Age of Onset   Healthy Mother    Diabetes Father    Colon cancer Neg Hx     Past Surgical History:  Procedure Laterality Date   ANGIOPLASTY  2001   1 stent   KNEE ARTHROSCOPY  2009   right   skin cancer removal  2014   nose and ear    ROS: Review of Systems Negative except as stated above  PHYSICAL EXAM: BP 133/68 (BP Location: Left Arm, Patient Position: Sitting, Cuff Size: Normal)   Pulse 66   Temp 97.9 F (36.6 C) (Oral)   Ht 5\' 7"  (1.702 m)   Wt 144 lb (65.3 kg)   SpO2 97%   BMI 22.55 kg/m   Physical Exam BP 140/70 General appearance - alert, well appearing, and in no distress Mental status - normal mood, behavior, speech, dress, motor activity, and thought processes Neck - supple, no significant adenopathy Chest - clear to auscultation, no wheezes, rales or rhonchi, symmetric air entry Heart - normal rate, regular rhythm, normal S1, S2, no murmurs, rubs, clicks or gallops GU 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  It is compressible and nontender Extremities - peripheral pulses normal, no pedal edema, no clubbing or cyanosis      No data to display              No data to display         ASSESSMENT AND PLAN: 1. Establishing care with new doctor, encounter for   2. Coronary artery disease involving native coronary artery of native heart without angina pectoris Patient with history of MI in the past requiring stent placement. Advised to continue baby aspirin daily.  Advised that he should be on statin therapy and looks like he was on Pravachol in the past but had stopped taking that and metoprolol.  He is agreeable to being restarted on statin therapy.  Advised that statins can cause muscle aches and can sometimes affect the liver.  We  will need to check a baseline liver function test today.  If it is good, he can go ahead and start the atorvastatin.  Pulse is in the 60s so I will hold off on starting beta-blocker. Strongly advised to quit smoking. - CBC - Comprehensive metabolic panel - Lipid panel - atorvastatin (LIPITOR) 20 MG tablet; Take 1 tablet (20 mg total) by mouth daily.  Dispense: 30 tablet; Refill: 3  3. Tobacco dependence Pt is current smoker. Patient advised to quit smoking. Discussed health risks associated with smoking including lung and other types of cancers, chronic lung diseases and CV risks.. Pt ready to give trail of quitting.   Discussed methods to help quit including quitting cold [place], use of NRT, Chantix and Bupropion.  Pt wanting to try: Chantix.  Went over the starter pack which is done the first month then continuation pack for 2 to 3 months.  Advised that the medication can cause mood swings and bad dreams. _3_ Minutes spent on counseling. F/U: Reassess progress on subsequent visit.  - Varenicline Tartrate, Starter, (CHANTIX STARTING MONTH PAK) 0.5 MG X 11 & 1 MG X 42 TBPK; Use as directed  Dispense: 53 each; Refill: 0  4. Inguinal hernia of left side without obstruction or gangrene Advised patient to avoid any heavy lifting, pushing or pulling.  Went over signs/symptoms that would suggest gangrene/strangulation and need for emergency room visit.  He is agreeable to referral to a general surgeon - Ambulatory referral to General Surgery  5. Constipation, unspecified constipation type - polyethylene glycol powder (GLYCOLAX/MIRALAX) 17 GM/SCOOP powder; Take 17 g by mouth daily as needed.  Dispense: 3350 g; Refill: 1  6. Pneumococcal vaccination declined Recommended.  Patient declined.  7. Tetanus, diphtheria, and acellular pertussis (Tdap) vaccination declined   8. Prostate cancer screening Patient agreeable to PSA level for prostate cancer screening. - PSA  9. Screening for colon  cancer - Ambulatory referral to Gastroenterology  10. Positive depression screening Patient reports this is not a major issue for him at this time.    Patient was given the opportunity to ask questions.  Patient verbalized understanding of the plan and was able to repeat key elements of the plan.   This documentation was completed using Paediatric nurseDragon voice recognition technology.  Any transcriptional errors are unintentional.  No orders of the defined types were placed in this encounter.    Requested Prescriptions    No prescriptions requested or ordered in this encounter    No follow-ups on file.  Jonah Blueeborah Mariesha Venturella, MD, FACP

## 2022-12-23 NOTE — Patient Instructions (Signed)
Continue aspirin.  We have added a cholesterol-lowering medication called atorvastatin to help decrease your risk for recurrent heart attack.  The medication can cause muscle cramps.  We will check baseline liver function test today.  Please hold off on starting the medication until you get the results of the liver function tests.  Prescription sent for Chantix to help with smoking cessation.  The medication can cause mood swings and bad dreams but works very well and decreasing cravings for cigarettes.  You have been referred to the gastroenterologist for colonoscopy. You have been referred to a general surgeon for the hernia.

## 2022-12-24 LAB — COMPREHENSIVE METABOLIC PANEL
ALT: 17 IU/L (ref 0–44)
AST: 17 IU/L (ref 0–40)
Albumin/Globulin Ratio: 1.6 (ref 1.2–2.2)
Albumin: 4.3 g/dL (ref 3.9–4.9)
Alkaline Phosphatase: 82 IU/L (ref 44–121)
BUN/Creatinine Ratio: 13 (ref 10–24)
BUN: 12 mg/dL (ref 8–27)
Bilirubin Total: 0.5 mg/dL (ref 0.0–1.2)
CO2: 23 mmol/L (ref 20–29)
Calcium: 9.7 mg/dL (ref 8.6–10.2)
Chloride: 99 mmol/L (ref 96–106)
Creatinine, Ser: 0.9 mg/dL (ref 0.76–1.27)
Globulin, Total: 2.7 g/dL (ref 1.5–4.5)
Glucose: 109 mg/dL — ABNORMAL HIGH (ref 70–99)
Potassium: 4.6 mmol/L (ref 3.5–5.2)
Sodium: 137 mmol/L (ref 134–144)
Total Protein: 7 g/dL (ref 6.0–8.5)
eGFR: 92 mL/min/{1.73_m2} (ref >59)

## 2022-12-24 LAB — CBC
Hematocrit: 50.3 % (ref 37.5–51.0)
Hemoglobin: 17 g/dL (ref 13.0–17.7)
MCH: 31.9 pg (ref 26.6–33.0)
MCHC: 33.8 g/dL (ref 31.5–35.7)
MCV: 94 fL (ref 79–97)
Platelets: 209 10*3/uL (ref 150–450)
RBC: 5.33 x10E6/uL (ref 4.14–5.80)
RDW: 13.6 % (ref 11.6–15.4)
WBC: 8 10*3/uL (ref 3.4–10.8)

## 2022-12-24 LAB — LIPID PANEL
Chol/HDL Ratio: 4.3 ratio (ref 0.0–5.0)
Cholesterol, Total: 217 mg/dL — ABNORMAL HIGH (ref 100–199)
HDL: 51 mg/dL (ref >39)
LDL Chol Calc (NIH): 153 mg/dL — ABNORMAL HIGH (ref 0–99)
Triglycerides: 71 mg/dL (ref 0–149)
VLDL Cholesterol Cal: 13 mg/dL (ref 5–40)

## 2022-12-24 LAB — PSA: Prostate Specific Ag, Serum: 5.9 ng/mL — ABNORMAL HIGH (ref 0.0–4.0)

## 2022-12-24 NOTE — Progress Notes (Signed)
Let patient know that his kidney and liver function tests are normal. Cholesterol level is 153 with goal being less than 70.  He should take the cholesterol-lowering medication called atorvastatin as was prescribed on recent visit. PSA level, the screening test for prostate cancer, is mildly elevated.  I recommend referral to urology for further evaluation and management.  Please let me know if patient is okay with me submitting the referral to the urologist.

## 2022-12-26 ENCOUNTER — Telehealth: Payer: Self-pay

## 2022-12-26 NOTE — Telephone Encounter (Signed)
Copied from CRM [id number]. Topic: General - Other >> Dec 26, 2022 11:55 AM Everette C wrote: Reason for CRM: The patient would like to be contacted by a member of staff regarding their lab results   The patient would like to further review when possible

## 2022-12-26 NOTE — Telephone Encounter (Signed)
Called & spoke to the patient on 12/26/2022. All lab results were given please refer to lab notes on 12/23/2022. No further questions at night.

## 2022-12-28 ENCOUNTER — Other Ambulatory Visit: Payer: Self-pay | Admitting: Internal Medicine

## 2022-12-28 DIAGNOSIS — R972 Elevated prostate specific antigen [PSA]: Secondary | ICD-10-CM

## 2022-12-30 ENCOUNTER — Ambulatory Visit: Payer: Medicare Other | Attending: Internal Medicine

## 2022-12-30 DIAGNOSIS — Z0000 Encounter for general adult medical examination without abnormal findings: Secondary | ICD-10-CM | POA: Diagnosis not present

## 2022-12-30 NOTE — Progress Notes (Signed)
Subjective:   Caleb Le is a 72 y.o. male who presents for Medicare Annual/Subsequent preventive examination.  Review of Systems     I connected with Caleb Le on 12/30/2022 at 2:35 pm by telephone and verified that I am speaking with the correct person using two identifiers. I discussed the limitations, risks, security and privacy concerns of performing an evaluation and management service by telephone and the availability of in person appointments. I also discussed with the patient that there may be a patient responsible charge related to this service. The patient expressed understanding and agreed to proceed.   Patient location: Home My Location: [facility] Persons on the telephone call: Myself and [name]           Objective:    There were no vitals filed for this visit. There is no height or weight on file to calculate BMI.     12/30/2022    2:45 PM  Advanced Directives  Does Patient Have a Medical Advance Directive? Yes  Type of Advance Directive Living will    Current Medications (verified) Outpatient Encounter Medications as of 12/30/2022  Medication Sig   aspirin 81 MG chewable tablet Chew 81 mg by mouth daily.     atorvastatin (LIPITOR) 20 MG tablet Take 1 tablet (20 mg total) by mouth daily.   polyethylene glycol powder (GLYCOLAX/MIRALAX) 17 GM/SCOOP powder Take 17 g by mouth daily as needed.   Varenicline Tartrate, Starter, (CHANTIX STARTING MONTH PAK) 0.5 MG X 11 & 1 MG X 42 TBPK Use as directed   No facility-administered encounter medications on file as of 12/30/2022.    Allergies (verified) Patient has no known allergies.   History: Past Medical History:  Diagnosis Date   Arthritis    Hypercholesteremia    MI (myocardial infarction) 2001   Skin cancer 2014   ear   Past Surgical History:  Procedure Laterality Date   ANGIOPLASTY  2001   1 stent   KNEE ARTHROSCOPY  2009   right   skin cancer removal  2014   nose and  ear   Family History  Problem Relation Age of Onset   Healthy Mother    Diabetes Father    Colon cancer Neg Hx    Social History   Socioeconomic History   Marital status: Divorced    Spouse name: Not on file   Number of children: Not on file   Years of education: Not on file   Highest education level: Not on file  Occupational History   Not on file  Tobacco Use   Smoking status: Former    Types: Cigarettes    Quit date: 10/20/2012    Years since quitting: 10.2   Smokeless tobacco: Never  Vaping Use   Vaping Use: Never used  Substance and Sexual Activity   Alcohol use: No    Comment: stopped drinking alcohol November 2013   Drug use: No   Sexual activity: Not on file  Other Topics Concern   Not on file  Social History Narrative   Not on file   Social Determinants of Health   Financial Resource Strain: Low Risk  (12/30/2022)   Overall Financial Resource Strain (CARDIA)    Difficulty of Paying Living Expenses: Not hard at all  Food Insecurity: No Food Insecurity (12/30/2022)   Hunger Vital Sign    Worried About Running Out of Food in the Last Year: Never true    Ran Out of Food in the  Last Year: Never true  Transportation Needs: No Transportation Needs (12/30/2022)   PRAPARE - Administrator, Civil Service (Medical): No    Lack of Transportation (Non-Medical): No  Physical Activity: Not on file  Stress: No Stress Concern Present (12/30/2022)   [employer] of Occupational Health - Occupational Stress Questionnaire    Feeling of Stress : Not at all  Social Connections: Unknown (12/30/2022)   Social Connection and Isolation Panel [NHANES]    Frequency of Communication with Friends and Family: More than three times a week    Frequency of Social Gatherings with Friends and Family: Twice a week    Attends Religious Services: Never    Database administrator or Organizations: No    Attends Engineer, structural: Never    Marital Status: Patient  declined    Tobacco Counseling Counseling given: Not Answered   Clinical Intake:  Pre-visit preparation completed: No  Pain : No/denies pain     Diabetes: No  How often do you need to have someone help you when you read instructions, pamphlets, or other written materials from your doctor or pharmacy?: 1 - Never    Interpreter Needed?: No      Activities of Daily Living    12/30/2022    2:46 PM  In your present state of health, do you have any difficulty performing the following activities:  Hearing? 0  Vision? 0  Difficulty concentrating or making decisions? 0  Walking or climbing stairs? 0  Dressing or bathing? 0  Doing errands, shopping? 0  Preparing Food and eating ? N  Using the Toilet? N  In the past six months, have you accidently leaked urine? N  Do you have problems with loss of bowel control? N  Managing your Medications? N  Managing your Finances? N  Housekeeping or managing your Housekeeping? N    Patient Care Team: Marcine Matar, MD as PCP - General (Internal Medicine)  Indicate any recent Medical Services you may have received from other than [facility] providers in the past year (date may be approximate).     Assessment:   This is a routine wellness examination for Brighten.  Hearing/Vision screen No results found.  Dietary issues and exercise activities discussed: Current Exercise Habits: The patient does not participate in regular exercise at present   Goals Addressed   None    Depression Screen    12/30/2022    2:46 PM 12/23/2022    9:58 AM  PHQ 2/9 Scores  PHQ - 2 Score 0 0  PHQ- 9 Score  2    Fall Risk    12/30/2022    2:46 PM 12/23/2022    9:00 AM  Fall Risk   Falls in the past year? 0 1  Number falls in past yr: 0 0  Injury with Fall? 0 0  Risk for fall due to : No Fall Risks History of fall(s)    FALL RISK PREVENTION PERTAINING TO THE HOME:  Any stairs in or around the home? No  If so, are there any without  handrails? No  Home free of loose throw rugs in walkways, pet beds, electrical cords, etc? Yes  Adequate lighting in your home to reduce risk of falls? Yes   ASSISTIVE DEVICES UTILIZED TO PREVENT FALLS:  Life alert? No  Use of a cane, walker or w/c? Yes  Grab bars in the bathroom? No  Shower chair or bench in shower? No  Elevated toilet seat  or a handicapped toilet? No   TIMED UP AND GO:  Was the test performed? No .  Length of time to ambulate 10 feet: N/A sec.   Gait slow and steady with assistive device  Cognitive Function:    12/30/2022    2:51 PM  MMSE - Mini Mental State Exam  Not completed: Unable to complete        Immunizations Immunization History  Administered Date(s) Administered   PFIZER(Purple Top)SARS-COV-2 Vaccination 11/10/2019, 12/04/2019, 09/04/2020    TDAP status: Due, Education has been provided regarding the importance of this vaccine. Advised may receive this vaccine at local pharmacy or Health Dept. Aware to provide a copy of the vaccination record if obtained from local pharmacy or Health Dept. Verbalized acceptance and understanding.  Flu Vaccine status: Declined, Education has been provided regarding the importance of this vaccine but patient still declined. Advised may receive this vaccine at local pharmacy or Health Dept. Aware to provide a copy of the vaccination record if obtained from local pharmacy or Health Dept. Verbalized acceptance and understanding.  Pneumococcal vaccine status: Declined,  Education has been provided regarding the importance of this vaccine but patient still declined. Advised may receive this vaccine at local pharmacy or Health Dept. Aware to provide a copy of the vaccination record if obtained from local pharmacy or Health Dept. Verbalized acceptance and understanding.   Covid-19 vaccine status: Completed vaccines  Qualifies for Shingles Vaccine? No   Zostavax completed  Patient Declined   Shingrix Completed?: No.     Education has been provided regarding the importance of this vaccine. Patient has been advised to call insurance company to determine out of pocket expense if they have not yet received this vaccine. Advised may also receive vaccine at local pharmacy or Health Dept. Verbalized acceptance and understanding.  Screening Tests Health Maintenance  Topic Date Due   Medicare Annual Wellness (AWV)  Never done   Hepatitis C Screening  Never done   COLONOSCOPY (Pts 45-77yrs Insurance coverage will need to be confirmed)  04/04/2018   COVID-19 Vaccine (4 - 2023-24 season) 05/20/2022   Pneumonia Vaccine 8+ Years old (1 of 1 - PCV) 12/23/2023 (Originally 05/16/2018)   Zoster Vaccines- Shingrix (1 of 2) 12/23/2023 (Originally 05/17/2003)   INFLUENZA VACCINE  04/20/2023   HPV VACCINES  Aged Out   DTaP/Tdap/Td  Discontinued    Health Maintenance  Health Maintenance Due  Topic Date Due   Medicare Annual Wellness (AWV)  Never done   Hepatitis C Screening  Never done   COLONOSCOPY (Pts 45-70yrs Insurance coverage will need to be confirmed)  04/04/2018   COVID-19 Vaccine (4 - 2023-24 season) 05/20/2022    Colorectal cancer screening: Referral to GI placed 12/23/2022. Pt aware the office will call re: appt.  Lung Cancer Screening: (Low Dose CT Chest recommended if Age 43-80 years, 30 pack-year currently smoking OR have quit w/in 15years.) does not qualify.   Lung Cancer Screening Referral: N/A  Additional Screening:  Hepatitis C Screening: does qualify; Completed Next office visit  Vision Screening: Recommended annual ophthalmology exams for early detection of glaucoma and other disorders of the eye. Is the patient up to date with their annual eye exam?  No  Who is the provider or what is the name of the office in which the patient attends annual eye exams? N/A If pt is not established with a provider, would they like to be referred to a provider to establish care? No .   Dental Screening:  Recommended annual dental exams for proper oral hygiene  Community Resource Referral / Chronic Care Management: CRR required this visit?  No   CCM required this visit?  No      Plan:     I have personally reviewed and noted the following in the patient's chart:   Medical and social history Use of alcohol, tobacco or illicit drugs  Current medications and supplements including opioid prescriptions. Patient is not currently taking opioid prescriptions. Functional ability and status Nutritional status Physical activity Advanced directives List of other physicians Hospitalizations, surgeries, and ER visits in previous 12 months Vitals Screenings to include cognitive, depression, and falls Referrals and appointments  In addition, I have reviewed and discussed with patient certain preventive protocols, quality metrics, and best practice recommendations. A written personalized care plan for preventive services as well as general preventive health recommendations were provided to patient.     Ronette Deter, CMA   12/30/2022   Nurse Notes: I spent 25 minutes on this telephone encounter AVS mailed to patinet

## 2022-12-30 NOTE — Patient Instructions (Signed)
Caleb Le , Thank you for taking time to come for your Medicare Wellness Visit. I appreciate your ongoing commitment to your health goals. Please review the following plan we discussed and let me know if I can assist you in the future.   These are the goals we discussed:  Goals   None     This is a list of the screening recommended for you and due dates:  Health Maintenance  Topic Date Due   Hepatitis C Screening: USPSTF Recommendation to screen - Ages 49-79 yo.  Never done   Colon Cancer Screening  04/04/2018   COVID-19 Vaccine (4 - 2023-24 season) 05/20/2022   Pneumonia Vaccine (1 of 1 - PCV) 12/23/2023*   Zoster (Shingles) Vaccine (1 of 2) 12/23/2023*   Flu Shot  04/20/2023   Medicare Annual Wellness Visit  12/30/2023   HPV Vaccine  Aged Out   DTaP/Tdap/Td vaccine  Discontinued  *Topic was postponed. The date shown is not the original due date.   Health Maintenance After Age 22 After age 29, you are at a higher risk for certain long-term diseases and infections as well as injuries from falls. Falls are a major cause of broken bones and head injuries in people who are older than age 58. Getting regular preventive care can help to keep you healthy and well. Preventive care includes getting regular testing and making lifestyle changes as recommended by your health care provider. Talk with your health care provider about: Which screenings and tests you should have. A screening is a test that checks for a disease when you have no symptoms. A diet and exercise plan that is right for you. What should I know about screenings and tests to prevent falls? Screening and testing are the best ways to find a health problem early. Early diagnosis and treatment give you the best chance of managing medical conditions that are common after age 57. Certain conditions and lifestyle choices may make you more likely to have a fall. Your health care provider may recommend: Regular vision checks. Poor  vision and conditions such as cataracts can make you more likely to have a fall. If you wear glasses, make sure to get your prescription updated if your vision changes. Medicine review. Work with your health care provider to regularly review all of the medicines you are taking, including over-the-counter medicines. Ask your health care provider about any side effects that may make you more likely to have a fall. Tell your health care provider if any medicines that you take make you feel dizzy or sleepy. Strength and balance checks. Your health care provider may recommend certain tests to check your strength and balance while standing, walking, or changing positions. Foot health exam. Foot pain and numbness, as well as not wearing proper footwear, can make you more likely to have a fall. Screenings, including: Osteoporosis screening. Osteoporosis is a condition that causes the bones to get weaker and break more easily. Blood pressure screening. Blood pressure changes and medicines to control blood pressure can make you feel dizzy. Depression screening. You may be more likely to have a fall if you have a fear of falling, feel depressed, or feel unable to do activities that you used to do. Alcohol use screening. Using too much alcohol can affect your balance and may make you more likely to have a fall. Follow these instructions at home: Lifestyle Do not drink alcohol if: Your health care provider tells you not to drink. If you drink alcohol:  Limit how much you have to: 0-1 drink a day for women. 0-2 drinks a day for men. Know how much alcohol is in your drink. In the [place] one drink equals one 12 oz bottle of beer (355 mL), one 5 oz glass of wine (148 mL), or one 1 oz glass of hard liquor (44 mL). Do not use any products that contain nicotine or tobacco. These products include cigarettes, chewing tobacco, and vaping devices, such as e-cigarettes. If you need help quitting, ask your health care  provider. Activity  Follow a regular exercise program to stay fit. This will help you maintain your balance. Ask your health care provider what types of exercise are appropriate for you. If you need a cane or walker, use it as recommended by your health care provider. Wear supportive shoes that have nonskid soles. Safety  Remove any tripping hazards, such as rugs, cords, and clutter. Install safety equipment such as grab bars in bathrooms and safety rails on stairs. Keep rooms and walkways well-lit. General instructions Talk with your health care provider about your risks for falling. Tell your health care provider if: You fall. Be sure to tell your health care provider about all falls, even ones that seem minor. You feel dizzy, tiredness (fatigue), or off-balance. Take over-the-counter and prescription medicines only as told by your health care provider. These include supplements. Eat a healthy diet and maintain a healthy weight. A healthy diet includes low-fat dairy products, low-fat (lean) meats, and fiber from whole grains, beans, and lots of fruits and vegetables. Stay current with your vaccines. Schedule regular health, dental, and eye exams. Summary Having a healthy lifestyle and getting preventive care can help to protect your health and wellness after age 56. Screening and testing are the best way to find a health problem early and help you avoid having a fall. Early diagnosis and treatment give you the best chance for managing medical conditions that are more common for people who are older than age 63. Falls are a major cause of broken bones and head injuries in people who are older than age 39. Take precautions to prevent a fall at home. Work with your health care provider to learn what changes you can make to improve your health and wellness and to prevent falls. This information is not intended to replace advice given to you by your health care provider. Make sure you discuss  any questions you have with your health care provider. Document Revised: 01/25/2021 Document Reviewed: 01/25/2021 Elsevier Patient Education  [place].

## 2023-01-11 DIAGNOSIS — K4090 Unilateral inguinal hernia, without obstruction or gangrene, not specified as recurrent: Secondary | ICD-10-CM | POA: Diagnosis not present

## 2023-01-31 DIAGNOSIS — K4090 Unilateral inguinal hernia, without obstruction or gangrene, not specified as recurrent: Secondary | ICD-10-CM | POA: Diagnosis not present

## 2023-02-07 ENCOUNTER — Ambulatory Visit (AMBULATORY_SURGERY_CENTER): Payer: Medicare Other | Admitting: *Deleted

## 2023-02-07 VITALS — Ht 67.0 in | Wt 150.0 lb

## 2023-02-07 DIAGNOSIS — Z800 Family history of malignant neoplasm of digestive organs: Secondary | ICD-10-CM

## 2023-02-07 DIAGNOSIS — Z86010 Personal history of colonic polyps: Secondary | ICD-10-CM

## 2023-02-07 MED ORDER — NA SULFATE-K SULFATE-MG SULF 17.5-3.13-1.6 GM/177ML PO SOLN
1.0000 | Freq: Once | ORAL | 0 refills | Status: AC
Start: 2023-02-07 — End: 2023-02-07

## 2023-02-07 NOTE — Progress Notes (Signed)
Pt's name and DOB verified at the beginning of the pre-visit.  Pt denies any difficulty with ambulating,sitting, laying down or rolling side to side Gave both [facility] main # and MD on call # prior to instructions.  No egg or soy allergy known to patient  No issues known to pt with past sedation with any surgeries or procedures Pt denies having issues being intubated Pt has no issues moving head neck or swallowing No FH of Malignant Hyperthermia Pt is not on diet pills Pt is not on home 02  Pt is not on blood thinners  Pt has frequent issues with constipation RN instructed pt to use Miralax per bottles instructions a week before prep days. Pt states they will Pt is not on dialysis Pt denise any abnormal heart rhythms  Pt denies any upcoming cardiac testing Pt encouraged to use to use Singlecare or Goodrx to reduce cost  Patient's chart reviewed by Cathlyn Parsons CNRA prior to pre-visit and patient appropriate for the [facility].  Pre-visit completed and red dot placed by patient's name on their procedure day (on provider's schedule).  . Visit by phone Pt states weight is 150 lb Instructed pt why it is important to and  to call if they have any changes in health or new medications. Directed them to the # given and on instructions.   Pt states they will.  Instructions reviewed with pt and pt states understanding. Instructed to review again prior to procedure. Pt states they will.  Instructions sent by mail with coupon and by my chart

## 2023-02-17 ENCOUNTER — Encounter: Payer: Self-pay | Admitting: Internal Medicine

## 2023-02-28 ENCOUNTER — Encounter: Payer: Self-pay | Admitting: Internal Medicine

## 2023-02-28 ENCOUNTER — Ambulatory Visit (AMBULATORY_SURGERY_CENTER): Payer: Medicare Other | Admitting: Internal Medicine

## 2023-02-28 VITALS — BP 111/57 | HR 54 | Temp 98.9°F | Resp 19 | Ht 67.0 in | Wt 150.0 lb

## 2023-02-28 DIAGNOSIS — D123 Benign neoplasm of transverse colon: Secondary | ICD-10-CM

## 2023-02-28 DIAGNOSIS — D125 Benign neoplasm of sigmoid colon: Secondary | ICD-10-CM | POA: Diagnosis not present

## 2023-02-28 DIAGNOSIS — D122 Benign neoplasm of ascending colon: Secondary | ICD-10-CM | POA: Diagnosis not present

## 2023-02-28 DIAGNOSIS — I252 Old myocardial infarction: Secondary | ICD-10-CM | POA: Diagnosis not present

## 2023-02-28 DIAGNOSIS — Z86010 Personal history of colonic polyps: Secondary | ICD-10-CM | POA: Diagnosis not present

## 2023-02-28 DIAGNOSIS — Z800 Family history of malignant neoplasm of digestive organs: Secondary | ICD-10-CM

## 2023-02-28 DIAGNOSIS — K635 Polyp of colon: Secondary | ICD-10-CM | POA: Diagnosis not present

## 2023-02-28 DIAGNOSIS — Z09 Encounter for follow-up examination after completed treatment for conditions other than malignant neoplasm: Secondary | ICD-10-CM | POA: Diagnosis not present

## 2023-02-28 MED ORDER — SODIUM CHLORIDE 0.9 % IV SOLN: 500.0000 mL | Freq: Once | INTRAVENOUS | Status: DC

## 2023-02-28 NOTE — Progress Notes (Signed)
GASTROENTEROLOGY PROCEDURE H&P NOTE   Primary Care Physician: Marcine Matar, MD    Reason for Procedure:  History of adenomatous and sessile serrated colon polyps plus family history of colon cancer in patient's father  Plan:    Colonoscopy  Patient is appropriate for endoscopic procedure(s) in the ambulatory ([facility]) setting.  The nature of the procedure, as well as the risks, benefits, and alternatives were carefully and thoroughly reviewed with the patient. Ample time for discussion and questions allowed. The patient understood, was satisfied, and agreed to proceed.     HPI: Caleb Le is a 72 y.o. male who presents for surveillance colonoscopy.  Medical history as below.  Tolerated the prep.  No recent chest pain or shortness of breath.  No abdominal pain today.  Past Medical History:  Diagnosis Date   Arthritis    Hypercholesteremia    MI (myocardial infarction) (HCC) 2001   Skin cancer 2014   ear    Past Surgical History:  Procedure Laterality Date   ANGIOPLASTY  2001   1 stent   KNEE ARTHROSCOPY  2009   right   skin cancer removal  2014   nose and ear    Prior to Admission medications   Medication Sig Start Date End Date Taking? Authorizing Provider  aspirin 81 MG chewable tablet Chew 81 mg by mouth daily.     Yes [provider]  polyethylene glycol powder (GLYCOLAX/MIRALAX) 17 GM/SCOOP powder Take 17 g by mouth daily as needed. 12/23/22  Yes Marcine Matar, MD  atorvastatin (LIPITOR) 20 MG tablet Take 1 tablet (20 mg total) by mouth daily. Patient not taking: Reported on 02/07/2023 12/23/22   Marcine Matar, MD  oxyCODONE-acetaminophen (PERCOCET/ROXICET) 5-325 MG tablet Take 1 tablet by mouth every 6 (six) hours as needed. Patient not taking: Reported on 02/28/2023    [provider]  Varenicline Tartrate, Starter, (CHANTIX STARTING MONTH PAK) 0.5 MG X 11 & 1 MG X 42 TBPK Use as directed Patient not taking: Reported on  02/28/2023 12/23/22   Marcine Matar, MD    Current Outpatient Medications  Medication Sig Dispense Refill   aspirin 81 MG chewable tablet Chew 81 mg by mouth daily.       polyethylene glycol powder (GLYCOLAX/MIRALAX) 17 GM/SCOOP powder Take 17 g by mouth daily as needed. 3350 g 1   atorvastatin (LIPITOR) 20 MG tablet Take 1 tablet (20 mg total) by mouth daily. (Patient not taking: Reported on 02/07/2023) 30 tablet 3   oxyCODONE-acetaminophen (PERCOCET/ROXICET) 5-325 MG tablet Take 1 tablet by mouth every 6 (six) hours as needed. (Patient not taking: Reported on 02/28/2023)     Varenicline Tartrate, Starter, (CHANTIX STARTING MONTH PAK) 0.5 MG X 11 & 1 MG X 42 TBPK Use as directed (Patient not taking: Reported on 02/28/2023) 53 each 0   Current Facility-Administered Medications  Medication Dose Route Frequency Provider Last Rate Last Admin   0.9 %  sodium chloride infusion  500 mL Intravenous Once Journii Nierman, Carie Caddy, MD        Allergies as of 02/28/2023   (No Known Allergies)    Family History  Problem Relation Age of Onset   Healthy Mother    Prostate cancer Father    Colon cancer Father    Diabetes Father    Colon polyps Neg Hx    Esophageal cancer Neg Hx    Rectal cancer Neg Hx    Stomach cancer Neg Hx  Social History   Socioeconomic History   Marital status: Divorced    Spouse name: Not on file   Number of children: Not on file   Years of education: Not on file   Highest education level: Not on file  Occupational History   Not on file  Tobacco Use   Smoking status: Former    Types: Cigarettes    Quit date: 2024    Years since quitting: 0.4   Smokeless tobacco: Never  Vaping Use   Vaping Use: Never used  Substance and Sexual Activity   Alcohol use: No    Comment: stopped drinking alcohol November 2013   Drug use: No   Sexual activity: Not on file  Other Topics Concern   Not on file  Social History Narrative   Not on file   Social Determinants of Health    Financial Resource Strain: Low Risk  (12/30/2022)   Overall Financial Resource Strain (CARDIA)    Difficulty of Paying Living Expenses: Not hard at all  Food Insecurity: No Food Insecurity (12/30/2022)   Hunger Vital Sign    Worried About Running Out of Food in the Last Year: Never true    Ran Out of Food in the Last Year: Never true  Transportation Needs: No Transportation Needs (12/30/2022)   PRAPARE - Administrator, Civil Service (Medical): No    Lack of Transportation (Non-Medical): No  Physical Activity: Not on file  Stress: No Stress Concern Present (12/30/2022)   [employer] of Occupational Health - Occupational Stress Questionnaire    Feeling of Stress : Not at all  Social Connections: Unknown (12/30/2022)   Social Connection and Isolation Panel [NHANES]    Frequency of Communication with Friends and Family: More than three times a week    Frequency of Social Gatherings with Friends and Family: Twice a week    Attends Religious Services: Never    Database administrator or Organizations: No    Attends Banker Meetings: Never    Marital Status: Patient declined  Catering manager Violence: Not At Risk (12/30/2022)   Humiliation, Afraid, Rape, and Kick questionnaire    Fear of Current or Ex-Partner: No    Emotionally Abused: No    Physically Abused: No    Sexually Abused: No    Physical Exam: Vital signs in last 24 hours: @BP  (!) 115/53   Pulse 64   Temp 98.9 F (37.2 C)   Ht 5\' 7"  (1.702 m)   Wt 150 lb (68 kg)   SpO2 97%   BMI 23.49 kg/m  GEN: NAD EYE: Sclerae anicteric ENT: MMM CV: Non-tachycardic Pulm: CTA b/l GI: Soft, NT/ND, 3 to 4 cm hematoma versus seroma left lower abdomen/superior to the inguinal canal without tenderness, erythema or fluctuance. NEURO:  Alert & Oriented x 3   Erick Blinks, MD [facility]  02/28/2023 11:10 AM

## 2023-02-28 NOTE — Op Note (Signed)
[facility] Patient Name: Caleb Le Procedure Date: 02/28/2023 11:10 AM MRN: [id number] Endoscopist: Beverley Fiedler , MD, [id number] Age: 72 Referring MD:  Date of Birth: 1951/02/06 Gender: Male Account #: [id number] Procedure:                Colonoscopy Indications:              High risk colon cancer surveillance: Personal                            history of sessile serrated colon polyp (less than                            10 mm in size) with no dysplasia and adenoma at                            last colonoscopy: July 2014; family history of                            colon cancer in father Medicines:                Monitored Anesthesia Care Procedure:                Pre-Anesthesia Assessment:                           - Prior to the procedure, a History and Physical                            was performed, and patient medications and                            allergies were reviewed. The patient's tolerance of                            previous anesthesia was also reviewed. The risks                            and benefits of the procedure and the sedation                            options and risks were discussed with the patient.                            All questions were answered, and informed consent                            was obtained. Prior Anticoagulants: The patient has                            taken no anticoagulant or antiplatelet agents. ASA                            Grade Assessment: III - A patient with severe  systemic disease. After reviewing the risks and                            benefits, the patient was deemed in satisfactory                            condition to undergo the procedure.                           After obtaining informed consent, the colonoscope                            was passed under direct vision. Throughout the                            procedure, the patient's blood pressure,  pulse, and                            oxygen saturations were monitored continuously. The                            CF [id number] #[id number] was introduced through the anus                            and advanced to the cecum, identified by the                            appendiceal orifice, ileocecal valve and palpation.                            The colonoscopy was performed without difficulty.                            The patient tolerated the procedure well. The                            quality of the bowel preparation was good. The                            ileocecal valve, appendiceal orifice, and rectum                            were photographed. Scope In: 11:23:48 AM Scope Out: 11:40:29 AM Scope Withdrawal Time: 0 hours 11 minutes 38 seconds  Total Procedure Duration: 0 hours 16 minutes 41 seconds  Findings:                 The digital rectal exam was normal.                           Two sessile polyps were found in the ascending                            colon. The polyps were 4 to 6 mm in size. These  polyps were removed with a cold snare. Resection                            and retrieval were complete.                           A 5 mm polyp was found in the transverse colon. The                            polyp was sessile. The polyp was removed with a                            cold snare. Resection and retrieval were complete.                           A 4 mm polyp was found in the sigmoid colon. The                            polyp was sessile. The polyp was removed with a                            cold snare. Resection and retrieval were complete.                           Multiple medium-mouthed and small-mouthed                            diverticula were found in the sigmoid colon and                            descending colon.                           Internal hemorrhoids were found during                            retroflexion. The  hemorrhoids were small. Complications:            No immediate complications. Estimated Blood Loss:     Estimated blood loss was minimal. Impression:               - Two 4 to 6 mm polyps in the ascending colon,                            removed with a cold snare. Resected and retrieved.                           - One 5 mm polyp in the transverse colon, removed                            with a cold snare. Resected and retrieved.                           - One 4 mm polyp  in the sigmoid colon, removed with                            a cold snare. Resected and retrieved.                           - Moderate diverticulosis in the sigmoid colon, in                            the descending colon, and at the hepatic flexure.                           - Small internal hemorrhoids. Recommendation:           - Patient has a contact number available for                            emergencies. The signs and symptoms of potential                            delayed complications were discussed with the                            patient. Return to normal activities tomorrow.                            Written discharge instructions were provided to the                            patient.                           - Resume previous diet.                           - Continue present medications.                           - Await pathology results.                           - Repeat colonoscopy is recommended for                            surveillance. The colonoscopy date will be                            determined after pathology results from today's                            exam become available for review. Beverley Fiedler, MD 02/28/2023 11:44:37 AM This report has been signed electronically.

## 2023-02-28 NOTE — Patient Instructions (Addendum)
Resume previous diet. Continue present medications. Await pathology results. Repeat colonoscopy is recommended for surveillance. The colonoscopy date will be determined after pathology results from today's exam become available for review.   YOU HAD AN ENDOSCOPIC PROCEDURE TODAY AT THE [facility]:   Refer to the procedure report that was given to you for any specific questions about what was found during the examination.  If the procedure report does not answer your questions, please call your gastroenterologist to clarify.  If you requested that your care partner not be given the details of your procedure findings, then the procedure report has been included in a sealed envelope for you to review at your convenience later.  YOU SHOULD EXPECT: Some feelings of bloating in the abdomen. Passage of more gas than usual.  Walking can help get rid of the air that was put into your GI tract during the procedure and reduce the bloating. If you had a lower endoscopy (such as a colonoscopy or flexible sigmoidoscopy) you may notice spotting of blood in your stool or on the toilet paper. If you underwent a bowel prep for your procedure, you may not have a normal bowel movement for a few days.  Please Note:  You might notice some irritation and congestion in your nose or some drainage.  This is from the oxygen used during your procedure.  There is no need for concern and it should clear up in a day or so.  SYMPTOMS TO REPORT IMMEDIATELY:  Following lower endoscopy (colonoscopy or flexible sigmoidoscopy):  Excessive amounts of blood in the stool  Significant tenderness or worsening of abdominal pains  Swelling of the abdomen that is new, acute  Fever of 100F or higher   For urgent or emergent issues, a gastroenterologist can be reached at any hour by calling [phone]. Do not use MyChart messaging for urgent concerns.    DIET:  We do recommend a small meal at first, but then you may  proceed to your regular diet.  Drink plenty of fluids but you should avoid alcoholic beverages for 24 hours.  ACTIVITY:  You should plan to take it easy for the rest of today and you should NOT DRIVE or use heavy machinery until tomorrow (because of the sedation medicines used during the test).    FOLLOW UP: Our staff will call the number listed on your records the next business day following your procedure.  We will call around 7:15- 8:00 am to check on you and address any questions or concerns that you may have regarding the information given to you following your procedure. If we do not reach you, we will leave a message.     If any biopsies were taken you will be contacted by phone or by letter within the next 1-3 weeks.  Please call us at [phone] if you have not heard about the biopsies in 3 weeks.    SIGNATURES/CONFIDENTIALITY: You and/or your care partner have signed paperwork which will be entered into your electronic medical record.  These signatures attest to the fact that that the information above on your After Visit Summary has been reviewed and is understood.  Full responsibility of the confidentiality of this discharge information lies with you and/or your care-partner. 

## 2023-02-28 NOTE — Progress Notes (Signed)
Sedate, gd SR, tolerated procedure well, VSS, report to RN 

## 2023-02-28 NOTE — Progress Notes (Signed)
Called to room to assist during endoscopic procedure.  Patient ID and intended procedure confirmed with present staff. Received instructions for my participation in the procedure from the performing physician.  

## 2023-03-01 ENCOUNTER — Telehealth: Payer: Self-pay

## 2023-03-01 NOTE — Telephone Encounter (Signed)
  Follow up Call-     02/28/2023   10:40 AM  Call back number  Post procedure Call Back phone  # [phone]  Permission to leave phone message Yes     Patient questions:  Do you have a fever, pain , or abdominal swelling? No. Pain Score  0 *  Have you tolerated food without any problems? Yes.    Have you been able to return to your normal activities? Yes.    Do you have any questions about your discharge instructions: Diet   No. Medications  No. Follow up visit  No.  Do you have questions or concerns about your Care? No.  Actions: * If pain score is 4 or above: No action needed, pain <4.

## 2023-03-08 ENCOUNTER — Encounter: Payer: Self-pay | Admitting: Internal Medicine

## 2023-04-28 ENCOUNTER — Encounter: Payer: Self-pay | Admitting: Internal Medicine

## 2023-04-28 ENCOUNTER — Ambulatory Visit: Payer: Medicare Other | Admitting: Internal Medicine

## 2023-04-28 VITALS — BP 116/62 | HR 65 | Temp 97.9°F | Ht 67.0 in | Wt 140.0 lb

## 2023-04-28 DIAGNOSIS — G4709 Other insomnia: Secondary | ICD-10-CM | POA: Diagnosis not present

## 2023-04-28 DIAGNOSIS — I2510 Atherosclerotic heart disease of native coronary artery without angina pectoris: Secondary | ICD-10-CM

## 2023-04-28 DIAGNOSIS — R972 Elevated prostate specific antigen [PSA]: Secondary | ICD-10-CM | POA: Diagnosis not present

## 2023-04-28 DIAGNOSIS — F17210 Nicotine dependence, cigarettes, uncomplicated: Secondary | ICD-10-CM | POA: Diagnosis not present

## 2023-04-28 DIAGNOSIS — M25531 Pain in right wrist: Secondary | ICD-10-CM | POA: Diagnosis not present

## 2023-04-28 DIAGNOSIS — F17200 Nicotine dependence, unspecified, uncomplicated: Secondary | ICD-10-CM

## 2023-04-28 NOTE — Patient Instructions (Signed)
You can purchase Voltaren gel over-the-counter and use it as needed on your right wrist.  If no improvement or if it gets worse with time, please let me know so that we can order an x-ray of the wrist.  Try not to drink any caffeinated beverages after 4 PM.  Please turn off the TV and all other lights once you get in bed.  Please start the atorvastatin to help lower your cholesterol.  Continue to work on quitting cigars.

## 2023-04-28 NOTE — Progress Notes (Signed)
Patient ID: Caleb Le, male    DOB: June 23, 1951  MRN: [id number]  CC: Follow-up (Follow-up. /Trouble falling asleep X1 yr/Soreness on R wrist X2 mo )   Subjective: Caleb Le is a 72 y.o. male who presents for chronic ds management His concerns today include:  Patient with history of cigar smoker, skin cancer, HL, history of MI/ stent placed 2013,   Presents for f/u visit.  C/o soreness RT wrist x 1 mth; mainly in mornings.  Better once he gets up and moving. No swelling/numbness/tingling.  Had ganglion cyst removed from LT wrist several yrs ago.  Wears splint PRN  C/o issues falling a sleeping all year.  Worse this time of year due to [place].  He drinks several Monster Drinks throughout the day up until his bedtime.  Normally gets in bed around 8 PM.  Sleeps with the TV on and also background sounds of the ocean.  CAD/Tob dep: On last visit, patient started on atorvastatin 20 mg daily; LDL was 153.  On aspirin: We held off on beta-blocker because pulse rate was in the 60s.  He was smoking about 8 cigarettes a day.  Expressed desire to quit.  Started him on Chantix.  Did not get the Chantix but not taking it every day.  He finds it helpful.  Trying to wean himself off cigarettes. -pt did get the Lipitor but did not start it as yet.  Wanted to get his c-scope, see urologist and have hernia repair first; all completed. Plans to start taking today.  Taking ASA daily. -No CP/SOB  Elevated PSA: PSA found to be 5.9 on last visit.  Referred to urology.  Did see [facility].  Had neg bx in past.  Plan to observe and has f/u 05/2023  LT inguinal hernia was repaired since last visit. Had c-scope.  Some polyps removed  HM: Due for flu shot and COVID booster. Patient Active Problem List   Diagnosis Date Noted   Tobacco dependence 12/23/2022   Inguinal hernia of left side without obstruction or  gangrene 12/23/2022   Coronary artery disease involving native coronary artery of native heart without angina pectoris 12/23/2022   Constipation 12/23/2022   Pneumococcal vaccination declined 12/23/2022   Tetanus, diphtheria, and acellular pertussis (Tdap) vaccination declined 12/23/2022     Current Outpatient Medications on File Prior to Visit  Medication Sig Dispense Refill   aspirin 81 MG chewable tablet Chew 81 mg by mouth daily.       polyethylene glycol powder (GLYCOLAX/MIRALAX) 17 GM/SCOOP powder Take 17 g by mouth daily as needed. 3350 g 1   atorvastatin (LIPITOR) 20 MG tablet Take 1 tablet (20 mg total) by mouth daily. (Patient not taking: Reported on 02/07/2023) 30 tablet 3   Varenicline Tartrate, Starter, (CHANTIX STARTING MONTH PAK) 0.5 MG X 11 & 1 MG X 42 TBPK Use as directed (Patient not taking: Reported on 02/28/2023) 53 each 0   No current facility-administered medications on file prior to visit.    No Known Allergies  Social History   Socioeconomic History   Marital status: Divorced    Spouse name: Not on file   Number of children: Not on file   Years of education: Not on file   Highest education level: Not on file  Occupational History   Not on file  Tobacco Use   Smoking status: Former    Current  packs/day: 0.00    Types: Cigarettes    Quit date: 2024    Years since quitting: 0.6   Smokeless tobacco: Never  Vaping Use   Vaping status: Never Used  Substance and Sexual Activity   Alcohol use: No    Comment: stopped drinking alcohol November 2013   Drug use: No   Sexual activity: Not on file  Other Topics Concern   Not on file  Social History Narrative   Not on file   Social Determinants of Health   Financial Resource Strain: Low Risk  (12/30/2022)   Overall Financial Resource Strain (CARDIA)    Difficulty of Paying Living Expenses: Not hard at all  Food Insecurity: No Food Insecurity (12/30/2022)   Hunger Vital Sign    Worried About Running Out of  Food in the Last Year: Never true    Ran Out of Food in the Last Year: Never true  Transportation Needs: No Transportation Needs (12/30/2022)   PRAPARE - Administrator, Civil Service (Medical): No    Lack of Transportation (Non-Medical): No  Physical Activity: Not on file  Stress: No Stress Concern Present (12/30/2022)   [employer] of Occupational Health - Occupational Stress Questionnaire    Feeling of Stress : Not at all  Social Connections: Unknown (12/30/2022)   Social Connection and Isolation Panel [NHANES]    Frequency of Communication with Friends and Family: More than three times a week    Frequency of Social Gatherings with Friends and Family: Twice a week    Attends Religious Services: Never    Database administrator or Organizations: No    Attends Banker Meetings: Never    Marital Status: Patient declined  Catering manager Violence: Not At Risk (12/30/2022)   Humiliation, Afraid, Rape, and Kick questionnaire    Fear of Current or Ex-Partner: No    Emotionally Abused: No    Physically Abused: No    Sexually Abused: No    Family History  Problem Relation Age of Onset   Healthy Mother    Prostate cancer Father    Colon cancer Father    Diabetes Father    Colon polyps Neg Hx    Esophageal cancer Neg Hx    Rectal cancer Neg Hx    Stomach cancer Neg Hx     Past Surgical History:  Procedure Laterality Date   ANGIOPLASTY  2001   1 stent   KNEE ARTHROSCOPY  2009   right   skin cancer removal  2014   nose and ear    ROS: Review of Systems Negative except as stated above  PHYSICAL EXAM: BP 116/62 (BP Location: Left Arm, Patient Position: Sitting, Cuff Size: Normal)   Pulse 65   Temp 97.9 F (36.6 C) (Oral)   Ht 5\' 7"  (1.702 m)   Wt 140 lb (63.5 kg)   SpO2 95%   BMI 21.93 kg/m   Wt Readings from Last 3 Encounters:  04/28/23 140 lb (63.5 kg)  02/28/23 150 lb (68 kg)  02/07/23 150 lb (68 kg)    Physical Exam   General  appearance - alert, well appearing, and in no distress Mental status - normal mood, behavior, speech, dress, motor activity, and thought processes Neck - supple, no significant adenopathy Chest - clear to auscultation, no wheezes, rales or rhonchi, symmetric air entry Heart - normal rate, regular rhythm, normal S1, S2, no murmurs, rubs, clicks or gallops Musculoskeletal -right wrist: No edema or erythema.  He has good range of motion.  Grip 4+/5 bilaterally Extremities -no lower extremity edema.     Latest Ref Rng & Units 12/23/2022   10:10 AM  CMP  Glucose 70 - 99 mg/dL 440   BUN 8 - 27 mg/dL 12   Creatinine 1.02 - 1.27 mg/dL 7.25   Sodium 366 - 440 mmol/L 137   Potassium 3.5 - 5.2 mmol/L 4.6   Chloride 96 - 106 mmol/L 99   CO2 20 - 29 mmol/L 23   Calcium 8.6 - 10.2 mg/dL 9.7   Total Protein 6.0 - 8.5 g/dL 7.0   Total Bilirubin 0.0 - 1.2 mg/dL 0.5   Alkaline Phos 44 - 121 IU/L 82   AST 0 - 40 IU/L 17   ALT 0 - 44 IU/L 17    Lipid Panel     Component Value Date/Time   CHOL 217 (H) 12/23/2022 1010   TRIG 71 12/23/2022 1010   HDL 51 12/23/2022 1010   CHOLHDL 4.3 12/23/2022 1010   LDLCALC 153 (H) 12/23/2022 1010    CBC    Component Value Date/Time   WBC 8.0 12/23/2022 1010   RBC 5.33 12/23/2022 1010   HGB 17.0 12/23/2022 1010   HCT 50.3 12/23/2022 1010   PLT 209 12/23/2022 1010   MCV 94 12/23/2022 1010   MCH 31.9 12/23/2022 1010   MCHC 33.8 12/23/2022 1010   RDW 13.6 12/23/2022 1010    ASSESSMENT AND PLAN:  1. Coronary artery disease involving native coronary artery of native heart without angina pectoris Stable.  Continue aspirin.  Advised and encouraged him to start the atorvastatin.  He plans to do so.  Hold off on placing him on beta-blocker as his pulse is in the 60s.  2. Tobacco dependence Continue to encourage him to quit.  He is aware of health risks associated with smoking.  3. Elevated PSA Seen by [facility].  Apparently had biopsy in the past  that was negative.  They plan to observe PSA for now.  He has a follow-up next month.  4. Wrist pain, acute, right Most likely due to arthritis.  I recommend purchasing Voltaren gel over-the-counter and using it as needed.  5. Other insomnia Good sleep hygiene discussed and encouraged. Patient advised not to drink any caffeinated beverages or excessive alcohol use within several hours of bedtime.  Advised to get in bed around about the same time every night.  Once in bed, turn off all lights and sounds.  If unable to fall asleep within 30 to 45 minutes of getting in bed, patient should get up and try to do something until he feels sleepy again.  At that time try getting back in bed.     Patient was given the opportunity to ask questions.  Patient verbalized understanding of the plan and was able to repeat key elements of the plan.   This documentation was completed using Paediatric nurse.  Any transcriptional errors are unintentional.  No orders of the defined types were placed in this encounter.    Requested Prescriptions    No prescriptions requested or ordered in this encounter    Return in about 4 months (around 08/28/2023).  Jonah Blue, MD, FACP

## 2023-06-07 ENCOUNTER — Other Ambulatory Visit: Payer: Self-pay | Admitting: Urology

## 2023-06-07 DIAGNOSIS — R972 Elevated prostate specific antigen [PSA]: Secondary | ICD-10-CM

## 2023-07-25 ENCOUNTER — Ambulatory Visit
Admission: RE | Admit: 2023-07-25 | Discharge: 2023-07-25 | Disposition: A | Discharge status: Home / Self Care | Payer: Medicare Other | Source: Ambulatory Visit | Attending: Urology | Admitting: Urology

## 2023-07-25 DIAGNOSIS — R972 Elevated prostate specific antigen [PSA]: Secondary | ICD-10-CM

## 2023-07-25 MED ORDER — GADOPICLENOL 0.5 MMOL/ML IV SOLN
7.0000 mL | Freq: Once | INTRAVENOUS | Status: AC | PRN
  Administered 2023-07-25: 7 mL via INTRAVENOUS

## 2023-08-29 ENCOUNTER — Ambulatory Visit: Payer: Medicare Other | Attending: Internal Medicine | Admitting: Internal Medicine

## 2023-08-29 ENCOUNTER — Encounter: Payer: Self-pay | Admitting: Internal Medicine

## 2023-08-29 VITALS — BP 111/61 | HR 67 | Temp 97.9°F | Ht 67.0 in | Wt 134.0 lb

## 2023-08-29 DIAGNOSIS — F1290 Cannabis use, unspecified, uncomplicated: Secondary | ICD-10-CM | POA: Diagnosis not present

## 2023-08-29 DIAGNOSIS — R739 Hyperglycemia, unspecified: Secondary | ICD-10-CM

## 2023-08-29 DIAGNOSIS — Z114 Encounter for screening for human immunodeficiency virus [HIV]: Secondary | ICD-10-CM

## 2023-08-29 DIAGNOSIS — R634 Abnormal weight loss: Secondary | ICD-10-CM | POA: Diagnosis not present

## 2023-08-29 DIAGNOSIS — Z1159 Encounter for screening for other viral diseases: Secondary | ICD-10-CM

## 2023-08-29 DIAGNOSIS — G4709 Other insomnia: Secondary | ICD-10-CM | POA: Diagnosis not present

## 2023-08-29 DIAGNOSIS — Z122 Encounter for screening for malignant neoplasm of respiratory organs: Secondary | ICD-10-CM

## 2023-08-29 DIAGNOSIS — F17200 Nicotine dependence, unspecified, uncomplicated: Secondary | ICD-10-CM

## 2023-08-29 DIAGNOSIS — I2510 Atherosclerotic heart disease of native coronary artery without angina pectoris: Secondary | ICD-10-CM | POA: Diagnosis not present

## 2023-08-29 MED ORDER — ATORVASTATIN CALCIUM 20 MG PO TABS
20.0000 mg | ORAL_TABLET | Freq: Every day | ORAL | 1 refills | Status: DC
Start: 2023-08-29 — End: 2024-06-10

## 2023-08-29 MED ORDER — VARENICLINE TARTRATE (STARTER) 0.5 MG X 11 & 1 MG X 42 PO TBPK
ORAL_TABLET | ORAL | 0 refills | Status: AC
Start: 2023-08-29 — End: ?

## 2023-08-29 NOTE — Patient Instructions (Signed)
I encourage you to cut back on drinking monster drinks.  Try to eat 3 full meals a day.  Prescription sent to your pharmacy for the Chantix starter pack.  Once you have completed the starter pack, please let me know so that we can send prescription for the continuation pack.  You will be called with an appointment for the CAT scan of the lungs for lung cancer screening.

## 2023-08-29 NOTE — Progress Notes (Signed)
Patient ID: Caleb Le, male    DOB: May 01, 1951  MRN: [id number]  CC: Follow-up (Follow-up. /Requesting a sleep aid/No to flu vax)   Subjective: Caleb Le is a 72 y.o. male who presents for chronic ds management. His concerns today include:  Patient with history of cigar smoker, skin cancer, HL, history of MI/ stent placed 2013,   Discussed the use of AI scribe software for clinical note transcription with the patient, who gave verbal consent to proceed.  History of Present Illness   Insomnia:  History from 04/28/23 visit: C/o issues falling a sleeping all year.  Worse this time of year due to [place].  He drinks several Monster Drinks throughout the day up until his bedtime.  Normally gets in bed around 8 PM.  Sleeps with the TV on and also background sounds of the ocean.  Good sleep hygiene was discussed and encouraged.  Advised to cut back on Monster drink and not to drink any caffeinated beverages within several hours of bedtime. Today: Patient reports that he still drinks monster drinks with last 1 being at 7 PM.  He gets in bed at 8 PM.  He has started smoking legal cannabis/Hemp product x 2 mths that he purchases at a store locally.  The patient reports that this product helps him relax and fall asleep easily.  He was wondering about a prescription sleep aid as his brother-in-law takes 1.  Tobacco dependence: He continues to smoke about 4 cigars a day.  He is smoked for over 20 years.  Did have a period where he had stopped for about 15 years but restarted about 10 years ago.  He is trying to quit again.  He did use Chantix earlier this year and found it helpful.  He is interested in restarting the Chantix.    Unintentional Weight Loss: The patient also reports unintentional weight loss over the past several months.  According to our scale, he has lost 10 pounds since April of this year.  He  only eats 1 meal a day which is usually dinner.  For breakfast he usually has a few cups of coffee and a monster drink.  Does not eat anything for lunch.  He denies any issues with depression but does worry about his daughter who has been battling heroin addiction for a number of years.   -No problems swallowing.  He has not had any fever or swollen glands.  Denies any abdominal pain.  No blood in the stool or the urine.  He has had 2 prostate biopsies in the past for elevated PSA which were negative.  He is up-to-date with colon cancer screening.   In regards to his heart disease, he reports compliance with atorvastatin and aspirin.    Patient Active Problem List   Diagnosis Date Noted   Tobacco dependence 12/23/2022   Inguinal hernia of left side without obstruction or gangrene 12/23/2022   Coronary artery disease involving native coronary artery of native heart without angina pectoris 12/23/2022   Constipation 12/23/2022   Pneumococcal vaccination declined 12/23/2022   Tetanus, diphtheria, and acellular pertussis (Tdap) vaccination declined 12/23/2022     Current Outpatient Medications on File Prior to Visit  Medication Sig Dispense Refill   aspirin 81 MG chewable tablet Chew 81 mg by mouth daily.       polyethylene glycol powder (GLYCOLAX/MIRALAX) 17 GM/SCOOP powder Take 17 g by  mouth daily as needed. 3350 g 1   No current facility-administered medications on file prior to visit.    No Known Allergies  Social History   Socioeconomic History   Marital status: Divorced    Spouse name: Not on file   Number of children: Not on file   Years of education: Not on file   Highest education level: Not on file  Occupational History   Not on file  Tobacco Use   Smoking status: Former    Current packs/day: 0.00    Types: Cigarettes    Quit date: 2024    Years since quitting: 0.9   Smokeless tobacco: Never  Vaping Use   Vaping status: Never Used  Substance and Sexual Activity    Alcohol use: No    Comment: stopped drinking alcohol November 2013   Drug use: No   Sexual activity: Not on file  Other Topics Concern   Not on file  Social History Narrative   Not on file   Social Determinants of Health   Financial Resource Strain: Low Risk  (12/30/2022)   Overall Financial Resource Strain (CARDIA)    Difficulty of Paying Living Expenses: Not hard at all  Food Insecurity: No Food Insecurity (12/30/2022)   Hunger Vital Sign    Worried About Running Out of Food in the Last Year: Never true    Ran Out of Food in the Last Year: Never true  Transportation Needs: No Transportation Needs (12/30/2022)   PRAPARE - Administrator, Civil Service (Medical): No    Lack of Transportation (Non-Medical): No  Physical Activity: Not on file  Stress: No Stress Concern Present (12/30/2022)   [employer] of Occupational Health - Occupational Stress Questionnaire    Feeling of Stress : Not at all  Social Connections: Unknown (12/30/2022)   Social Connection and Isolation Panel [NHANES]    Frequency of Communication with Friends and Family: More than three times a week    Frequency of Social Gatherings with Friends and Family: Twice a week    Attends Religious Services: Never    Database administrator or Organizations: No    Attends Banker Meetings: Never    Marital Status: Patient declined  Catering manager Violence: Not At Risk (12/30/2022)   Humiliation, Afraid, Rape, and Kick questionnaire    Fear of Current or Ex-Partner: No    Emotionally Abused: No    Physically Abused: No    Sexually Abused: No    Family History  Problem Relation Age of Onset   Healthy Mother    Prostate cancer Father    Colon cancer Father    Diabetes Father    Colon polyps Neg Hx    Esophageal cancer Neg Hx    Rectal cancer Neg Hx    Stomach cancer Neg Hx     Past Surgical History:  Procedure Laterality Date   ANGIOPLASTY  2001   1 stent   KNEE ARTHROSCOPY  2009    right   skin cancer removal  2014   nose and ear    ROS: Review of Systems Negative except as stated above  PHYSICAL EXAM: BP 111/61 (BP Location: Left Arm, Patient Position: Sitting, Cuff Size: Normal)   Pulse 67   Temp 97.9 F (36.6 C) (Oral)   Ht 5\' 7"  (1.702 m)   Wt 134 lb (60.8 kg)   SpO2 100%   BMI 20.99 kg/m   Wt Readings from Last 3 Encounters:  08/29/23 134 lb (60.8 kg)  04/28/23 140 lb (63.5 kg)  02/28/23 150 lb (68 kg)    Physical Exam  General appearance - alert, well appearing, elderly Caucasian male and in no distress Mental status - normal mood, behavior, speech, dress, motor activity, and thought processes.  Patient is very talkative and goes off in tangents and has to be redirected. Mouth - mucous membranes moist, pharynx normal without lesions Neck - supple, no significant adenopathy Lymphatics - no palpable lymphadenopathy, no hepatosplenomegaly Chest -breath sounds mildly decreased bilaterally.  No wheezes crackles or rhonchi's. Heart - normal rate, regular rhythm, normal S1, S2, no murmurs, rubs, clicks or gallops Abdomen - soft, nontender, nondistended, no masses or organomegaly Extremities - peripheral pulses normal, no pedal edema, no clubbing or cyanosis    04/28/2023    8:49 AM 12/30/2022    2:46 PM 12/23/2022    9:58 AM  Depression screen PHQ 2/9  Decreased Interest 0 0 0  Down, Depressed, Hopeless 0 0 0  PHQ - 2 Score 0 0 0  Altered sleeping   2  Tired, decreased energy   0  Change in appetite   0  Feeling bad or failure about yourself    0  Trouble concentrating   0  Moving slowly or fidgety/restless   0  Suicidal thoughts   0  PHQ-9 Score   2   -    Latest Ref Rng & Units 12/23/2022   10:10 AM  CMP  Glucose 70 - 99 mg/dL 086   BUN 8 - 27 mg/dL 12   Creatinine 5.78 - 1.27 mg/dL 4.69   Sodium 629 - 528 mmol/L 137   Potassium 3.5 - 5.2 mmol/L 4.6   Chloride 96 - 106 mmol/L 99   CO2 20 - 29 mmol/L 23   Calcium 8.6 - 10.2 mg/dL  9.7   Total Protein 6.0 - 8.5 g/dL 7.0   Total Bilirubin 0.0 - 1.2 mg/dL 0.5   Alkaline Phos 44 - 121 IU/L 82   AST 0 - 40 IU/L 17   ALT 0 - 44 IU/L 17    Lipid Panel     Component Value Date/Time   CHOL 217 (H) 12/23/2022 1010   TRIG 71 12/23/2022 1010   HDL 51 12/23/2022 1010   CHOLHDL 4.3 12/23/2022 1010   LDLCALC 153 (H) 12/23/2022 1010    CBC    Component Value Date/Time   WBC 8.0 12/23/2022 1010   RBC 5.33 12/23/2022 1010   HGB 17.0 12/23/2022 1010   HCT 50.3 12/23/2022 1010   PLT 209 12/23/2022 1010   MCV 94 12/23/2022 1010   MCH 31.9 12/23/2022 1010   MCHC 33.8 12/23/2022 1010   RDW 13.6 12/23/2022 1010    ASSESSMENT AND PLAN: 1. Other insomnia -Again I informed patient that the caffeine intake is likely playing a significant role in this.  Advised him to cut back on Monster drinks then he most likely would not need to smoke cannabis  2. Unintended weight loss Most likely due to the fact that he is eating only 1 meal a day.  Encouraged him to try getting his 3 meals a day and cut back on caffeinated beverages. - TSH+T4F+T3Free - CBC - Comprehensive metabolic panel  3. Cannabis use disorder  4. Coronary artery disease involving native coronary artery of native heart without angina pectoris Stable.  Continue aspirin and atorvastatin - atorvastatin (LIPITOR) 20 MG tablet; Take 1 tablet (20 mg total) by mouth  daily.  Dispense: 90 tablet; Refill: 1 - Comprehensive metabolic panel  5. Tobacco dependence Strongly advised to quit.  He is aware of ongoing health risks associated with smoking.  He is agreeable to restarting Chantix.  Prescription sent for the starter pack.  Once he has completed the starter pack he can call for the continuation pack. - Varenicline Tartrate, Starter, (CHANTIX STARTING MONTH PAK) 0.5 MG X 11 & 1 MG X 42 TBPK; Use as directed  Dispense: 53 each; Refill: 0  6. Screening for lung cancer Patient meets criteria for lung cancer screening  given history of cigarette smoking with greater than 20 pack years.  He is agreeable to screening with CT - CT CHEST LUNG CA SCREEN LOW DOSE W/O CM; Future  7. Need for hepatitis C screening test Patient agreeable to screening. - Hepatitis C Antibody  8. Screening for HIV (human immunodeficiency virus) Patient agreeable to screening. - HIV antibody (with reflex)  Addendum 08/30/2023: Patient with mild hyperglycemia on chemistry.  Will add A1c.  Patient was given the opportunity to ask questions.  Patient verbalized understanding of the plan and was able to repeat key elements of the plan.   This documentation was completed using Paediatric nurse.  Any transcriptional errors are unintentional.  Orders Placed This Encounter  Procedures   CT CHEST LUNG CA SCREEN LOW DOSE W/O CM   TSH+T4F+T3Free   CBC   Hepatitis C Antibody   HIV antibody (with reflex)   Comprehensive metabolic panel     Requested Prescriptions   Signed Prescriptions Disp Refills   atorvastatin (LIPITOR) 20 MG tablet 90 tablet 1    Sig: Take 1 tablet (20 mg total) by mouth daily.   Varenicline Tartrate, Starter, (CHANTIX STARTING MONTH PAK) 0.5 MG X 11 & 1 MG X 42 TBPK 53 each 0    Sig: Use as directed    Return in about 4 months (around 12/28/2023).  Jonah Blue, MD, FACP

## 2023-08-30 LAB — CBC
Hematocrit: 50.6 % (ref 37.5–51.0)
Hemoglobin: 16.7 g/dL (ref 13.0–17.7)
MCH: 32.6 pg (ref 26.6–33.0)
MCHC: 33 g/dL (ref 31.5–35.7)
MCV: 99 fL — ABNORMAL HIGH (ref 79–97)
Platelets: 225 10*3/uL (ref 150–450)
RBC: 5.13 x10E6/uL (ref 4.14–5.80)
RDW: 13.8 % (ref 11.6–15.4)
WBC: 7.7 10*3/uL (ref 3.4–10.8)

## 2023-08-30 LAB — COMPREHENSIVE METABOLIC PANEL
ALT: 13 [IU]/L (ref 0–44)
AST: 21 [IU]/L (ref 0–40)
Albumin: 4.2 g/dL (ref 3.9–4.9)
Alkaline Phosphatase: 88 [IU]/L (ref 44–121)
BUN/Creatinine Ratio: 19 (ref 10–24)
BUN: 16 mg/dL (ref 8–27)
Bilirubin Total: 0.4 mg/dL (ref 0.0–1.2)
CO2: 23 mmol/L (ref 20–29)
Calcium: 9.7 mg/dL (ref 8.6–10.2)
Chloride: 101 mmol/L (ref 96–106)
Creatinine, Ser: 0.85 mg/dL (ref 0.76–1.27)
Globulin, Total: 2.9 g/dL (ref 1.5–4.5)
Glucose: 110 mg/dL — ABNORMAL HIGH (ref 70–99)
Potassium: 4.6 mmol/L (ref 3.5–5.2)
Sodium: 139 mmol/L (ref 134–144)
Total Protein: 7.1 g/dL (ref 6.0–8.5)
eGFR: 93 mL/min/{1.73_m2} (ref >59)

## 2023-08-30 LAB — TSH+T4F+T3FREE
Free T4: 1.09 ng/dL (ref 0.82–1.77)
T3, Free: 2.9 pg/mL (ref 2.0–4.4)
TSH: 2.07 u[IU]/mL (ref 0.450–4.500)

## 2023-08-30 LAB — HEPATITIS C ANTIBODY: Hep C Virus Ab: NONREACTIVE

## 2023-08-30 LAB — HIV ANTIBODY (ROUTINE TESTING W REFLEX): HIV Screen 4th Generation wRfx: NONREACTIVE

## 2023-08-30 NOTE — Addendum Note (Signed)
Addended by: Jonah Blue B on: 08/30/2023 08:48 AM   Modules accepted: Orders

## 2023-09-02 LAB — HEMOGLOBIN A1C
Est. average glucose Bld gHb Est-mCnc: 123 mg/dL
Hgb A1c MFr Bld: 5.9 % — ABNORMAL HIGH (ref 4.8–5.6)

## 2023-09-02 LAB — SPECIMEN STATUS REPORT

## 2023-12-26 ENCOUNTER — Encounter: Payer: Self-pay | Admitting: Internal Medicine

## 2023-12-26 ENCOUNTER — Ambulatory Visit: Payer: Medicare Other | Attending: Internal Medicine | Admitting: Internal Medicine

## 2023-12-26 VITALS — BP 132/60 | HR 61 | Temp 97.6°F | Ht 67.0 in | Wt 132.0 lb

## 2023-12-26 DIAGNOSIS — R634 Abnormal weight loss: Secondary | ICD-10-CM

## 2023-12-26 DIAGNOSIS — F17200 Nicotine dependence, unspecified, uncomplicated: Secondary | ICD-10-CM

## 2023-12-26 DIAGNOSIS — R030 Elevated blood-pressure reading, without diagnosis of hypertension: Secondary | ICD-10-CM

## 2023-12-26 DIAGNOSIS — Z122 Encounter for screening for malignant neoplasm of respiratory organs: Secondary | ICD-10-CM

## 2023-12-26 DIAGNOSIS — F17290 Nicotine dependence, other tobacco product, uncomplicated: Secondary | ICD-10-CM | POA: Diagnosis not present

## 2023-12-26 DIAGNOSIS — Z2821 Immunization not carried out because of patient refusal: Secondary | ICD-10-CM

## 2023-12-26 DIAGNOSIS — S6010XA Contusion of unspecified finger with damage to nail, initial encounter: Secondary | ICD-10-CM | POA: Diagnosis not present
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
## 2023-12-26 NOTE — Patient Instructions (Addendum)
 VISIT SUMMARY:  During today's visit, we discussed your concerns about weight loss, smoking, and your recent finger injury. We also reviewed your diet and blood pressure. We have created a plan to address these issues and improve your overall health.  YOUR PLAN:  -TOBACCO USE DISORDER: Tobacco use disorder means you have a dependence on tobacco. We discussed retrying varenicline (Chantix) to help you quit smoking, and the importance of completing the full course for it to be effective.  -LUNG CANCER SCREENING: Lung cancer screening is a test to check for signs of lung cancer. We will resubmit the order for your CT scan and ensure the imaging center contacts you to schedule the appointment.  -HYPERTENSION: Hypertension means high blood pressure. Your blood pressure was slightly elevated today. We recommend reducing your salt intake and increasing your consumption of fruits, vegetables, and lean meats. We will recheck your blood pressure in several weeks.  -NUTRITIONAL DEFICIENCY: Nutritional deficiency means your diet may not be providing all the nutrients your body needs. We encourage you to eat three balanced meals per day and replace sweets with healthier snacks like [place] yogurt, nuts, and fruits.  -SUBUNGUAL HEMATOMA: A subungual hematoma is a collection of blood under the nail, usually caused by injury. Your finger injury should resolve on its own without any intervention.  INSTRUCTIONS:  Please follow up with the imaging center to schedule your lung cancer screening CT scan. We will recheck your blood pressure in several weeks. If you decide to retry varenicline to quit smoking, please let us know so we can support you through the process.  DASH Eating Plan DASH stands for Dietary Approaches to Stop Hypertension. The DASH eating plan is a healthy eating plan that has been shown to: Lower high blood pressure (hypertension). Reduce your risk for type 2 diabetes, heart disease, and  stroke. Help with weight loss. What are tips for following this plan? Reading food labels Check food labels for the amount of salt (sodium) per serving. Choose foods with less than 5 percent of the Daily Value (DV) of sodium. In general, foods with less than 300 milligrams (mg) of sodium per serving fit into this eating plan. To find whole grains, look for the word "whole" as the first word in the ingredient list. Shopping Buy products labeled as "low-sodium" or "no salt added." Buy fresh foods. Avoid canned foods and pre-made or frozen meals. Cooking Try not to add salt when you cook. Use salt-free seasonings or herbs instead of table salt or sea salt. Check with your health care provider or pharmacist before using salt substitutes. Do not fry foods. Cook foods in healthy ways, such as baking, boiling, grilling, roasting, or broiling. Cook using oils that are good for your heart. These include olive, canola, avocado, soybean, and sunflower oil. Meal planning  Eat a balanced diet. This should include: 4 or more servings of fruits and 4 or more servings of vegetables each day. Try to fill half of your plate with fruits and vegetables. 6-8 servings of whole grains each day. 6 or less servings of lean meat, poultry, or fish each day. 1 oz is 1 serving. A 3 oz (85 g) serving of meat is about the same size as the palm of your hand. One egg is 1 oz (28 g). 2-3 servings of low-fat dairy each day. One serving is 1 cup (237 mL). 1 serving of nuts, seeds, or beans 5 times each week. 2-3 servings of heart-healthy fats. Healthy fats called omega-3  fatty acids are found in foods such as walnuts, flaxseeds, fortified milks, and eggs. These fats are also found in cold-water fish, such as sardines, salmon, and mackerel. Limit how much you eat of: Canned or prepackaged foods. Food that is high in trans fat, such as fried foods. Food that is high in saturated fat, such as fatty meat. Desserts and other  sweets, sugary drinks, and other foods with added sugar. Full-fat dairy products. Do not salt foods before eating. Do not eat more than 4 egg yolks a week. Try to eat at least 2 vegetarian meals a week. Eat more home-cooked food and less restaurant, buffet, and fast food. Lifestyle When eating at a restaurant, ask if your food can be made with less salt or no salt. If you drink alcohol: Limit how much you have to: 0-1 drink a day if you are male. 0-2 drinks a day if you are male. Know how much alcohol is in your drink. In the [place] one drink is one 12 oz bottle of beer (355 mL), one 5 oz glass of wine (148 mL), or one 1 oz glass of hard liquor (44 mL). General information Avoid eating more than 2,300 mg of salt a day. If you have hypertension, you may need to reduce your sodium intake to 1,500 mg a day. Work with your provider to stay at a healthy body weight or lose weight. Ask what the best weight range is for you. On most days of the week, get at least 30 minutes of exercise that causes your heart to beat faster. This may include walking, swimming, or biking. Work with your provider or dietitian to adjust your eating plan to meet your specific calorie needs. What foods should I eat? Fruits All fresh, dried, or frozen fruit. Canned fruits that are in their natural juice and do not have sugar added to them. Vegetables Fresh or frozen vegetables that are raw, steamed, roasted, or grilled. Low-sodium or reduced-sodium tomato and vegetable juice. Low-sodium or reduced-sodium tomato sauce and tomato paste. Low-sodium or reduced-sodium canned vegetables. Grains Whole-grain or whole-wheat bread. Whole-grain or whole-wheat pasta. Brown rice. Orpah Cobb. Bulgur. Whole-grain and low-sodium cereals. Pita bread. Low-fat, low-sodium crackers. Whole-wheat flour tortillas. Meats and other proteins Skinless chicken or [place]. Ground chicken or [place]. Pork with fat trimmed off. Fish and seafood.  Egg whites. Dried beans, peas, or lentils. Unsalted nuts, nut butters, and seeds. Unsalted canned beans. Lean cuts of beef with fat trimmed off. Low-sodium, lean precooked or cured meat, such as sausages or meat loaves. Dairy Low-fat (1%) or fat-free (skim) milk. Reduced-fat, low-fat, or fat-free cheeses. Nonfat, low-sodium ricotta or cottage cheese. Low-fat or nonfat yogurt. Low-fat, low-sodium cheese. Fats and oils Soft margarine without trans fats. Vegetable oil. Reduced-fat, low-fat, or light mayonnaise and salad dressings (reduced-sodium). Canola, safflower, olive, avocado, soybean, and sunflower oils. Avocado. Seasonings and condiments Herbs. Spices. Seasoning mixes without salt. Other foods Unsalted popcorn and pretzels. Fat-free sweets. The items listed above may not be all the foods and drinks you can have. Talk to a dietitian to learn more. What foods should I avoid? Fruits Canned fruit in a light or heavy syrup. Fried fruit. Fruit in cream or butter sauce. Vegetables Creamed or fried vegetables. Vegetables in a cheese sauce. Regular canned vegetables that are not marked as low-sodium or reduced-sodium. Regular canned tomato sauce and paste that are not marked as low-sodium or reduced-sodium. Regular tomato and vegetable juices that are not marked as low-sodium or reduced-sodium. Rosita Fire.  Olives. Grains Baked goods made with fat, such as croissants, muffins, or some breads. Dry pasta or rice meal packs. Meats and other proteins Fatty cuts of meat. Ribs. Fried meat. Tomasa Blase. Bologna, salami, and other precooked or cured meats, such as sausages or meat loaves, that are not lean and low in sodium. Fat from the back of a pig (fatback). Bratwurst. Salted nuts and seeds. Canned beans with added salt. Canned or smoked fish. Whole eggs or egg yolks. Chicken or [place] with skin. Dairy Whole or 2% milk, cream, and half-and-half. Whole or full-fat cream cheese. Whole-fat or sweetened yogurt.  Full-fat cheese. Nondairy creamers. Whipped toppings. Processed cheese and cheese spreads. Fats and oils Butter. Stick margarine. Lard. Shortening. Ghee. Bacon fat. Tropical oils, such as coconut, palm kernel, or palm oil. Seasonings and condiments Onion salt, garlic salt, seasoned salt, table salt, and sea salt. Worcestershire sauce. Tartar sauce. Barbecue sauce. Teriyaki sauce. Soy sauce, including reduced-sodium soy sauce. Steak sauce. Canned and packaged gravies. Fish sauce. Oyster sauce. Cocktail sauce. Store-bought horseradish. Ketchup. Mustard. Meat flavorings and tenderizers. Bouillon cubes. Hot sauces. Pre-made or packaged marinades. Pre-made or packaged taco seasonings. Relishes. Regular salad dressings. Other foods Salted popcorn and pretzels. The items listed above may not be all the foods and drinks you should avoid. Talk to a dietitian to learn more. Where to find more information National Heart, Lung, and Blood Institute (NHLBI): BuffaloDryCleaner.gl American Heart Association (AHA): heart.org Academy of Nutrition and Dietetics: eatright.org National Kidney Foundation (NKF): kidney.org This information is not intended to replace advice given to you by your health care provider. Make sure you discuss any questions you have with your health care provider. Document Revised: 09/22/2022 Document Reviewed: 09/22/2022 Elsevier Patient Education  2024 [employer].

## 2024-02-08 ENCOUNTER — Encounter: Payer: Self-pay | Admitting: Internal Medicine

## 2024-02-08 ENCOUNTER — Ambulatory Visit: Attending: Internal Medicine | Admitting: Internal Medicine

## 2024-02-08 VITALS — BP 121/65 | HR 66 | Temp 97.9°F | Ht 67.0 in | Wt 131.0 lb

## 2024-02-08 DIAGNOSIS — N521 Erectile dysfunction due to diseases classified elsewhere: Secondary | ICD-10-CM | POA: Diagnosis not present

## 2024-02-08 DIAGNOSIS — R7303 Prediabetes: Secondary | ICD-10-CM | POA: Diagnosis not present

## 2024-02-08 DIAGNOSIS — I779 Disorder of arteries and arterioles, unspecified: Secondary | ICD-10-CM

## 2024-02-08 DIAGNOSIS — R634 Abnormal weight loss: Secondary | ICD-10-CM | POA: Diagnosis not present

## 2024-02-08 DIAGNOSIS — Z122 Encounter for screening for malignant neoplasm of respiratory organs: Secondary | ICD-10-CM

## 2024-02-08 MED ORDER — SILDENAFIL CITRATE 50 MG PO TABS: ORAL_TABLET | ORAL | 1 refills | Status: DC

## 2024-02-08 NOTE — Progress Notes (Signed)
 Patient ID: Caleb Le, male    DOB: [birth year]  MRN: [id number]  CC: Hypertension (BP recheck. /)   Subjective: Caleb Le is a 73 y.o. male who presents for for recheck BP and unintentional wgh loss.  His concerns today include:  Patient with history of cigar smoker, skin cancer, HL, history of MI/ stent placed 2013, prediabetes   Discussed the use of AI scribe software for clinical note transcription with the patient, who gave verbal consent to proceed.  History of Present Illness Caleb Le is a 73 year old male with hypertension who presents for a follow-up visit to recheck blood pressure and discuss weight management.  His blood pressure is currently 121/65 mmHg, improved from previous readings in early April. He has maintained a stable weight of 131 pounds, a slight decrease from 132 pounds. He has reduced his intake of Monster drinks and ice cream, previously consumed in high quantities. He is aware of diabetes symptoms and recalls being in the prediabetes range, prompting him to cut back on sugary drinks. He typically drinks one Monster drink in the morning after coffee and eats a salad daily. He has reduced cheesecake to an occasional treat and usually eats one meal a day, adjusting to three meals when his sister visits. He finds eating earlier in the afternoon beneficial.  Requesting refill on sildenafil which he states was prescribed to him last year by his urologist Dr. Amye Baller.  He will see him again in July of this year but does not want to wait until then for refill.  States he had prescribed a 100 mg but he takes a half a tablet as needed.  He is not on long-acting nitrates.  Has nitroglycerin sublingual in his car but it is outdated and has had the same bottle for several years without having to use.    Patient Active Problem List   Diagnosis Date Noted   Tobacco dependence 12/23/2022   Inguinal hernia of left side without obstruction or gangrene  12/23/2022   Coronary artery disease involving native coronary artery of native heart without angina pectoris 12/23/2022   Constipation 12/23/2022   Pneumococcal vaccination declined 12/23/2022   Tetanus, diphtheria, and acellular pertussis (Tdap) vaccination declined 12/23/2022     Current Outpatient Medications on File Prior to Visit  Medication Sig Dispense Refill   aspirin 81 MG chewable tablet Chew 81 mg by mouth daily.       atorvastatin  (LIPITOR) 20 MG tablet Take 1 tablet (20 mg total) by mouth daily. 90 tablet 1   polyethylene glycol powder (GLYCOLAX /MIRALAX ) 17 GM/SCOOP powder Take 17 g by mouth daily as needed. 3350 g 1   Varenicline  Tartrate, Starter, (CHANTIX  STARTING MONTH PAK) 0.5 MG X 11 & 1 MG X 42 TBPK Use as directed 53 each 0   No current facility-administered medications on file prior to visit.    No Known Allergies  Social History   Socioeconomic History   Marital status: Divorced    Spouse name: Not on file   Number of children: Not on file   Years of education: Not on file   Highest education level: Not on file  Occupational History   Not on file  Tobacco Use   Smoking status: Former    Current packs/day: 0.00    Types: Cigarettes    Quit date: 2024    Years since quitting: 1.3   Smokeless tobacco: Never  Vaping Use   Vaping status: Never Used  Substance  and Sexual Activity   Alcohol use: No    Comment: stopped drinking alcohol November 2013   Drug use: No   Sexual activity: Not on file  Other Topics Concern   Not on file  Social History Narrative   Not on file   Social Drivers of Health   Financial Resource Strain: Low Risk  (12/30/2022)   Overall Financial Resource Strain (CARDIA)    Difficulty of Paying Living Expenses: Not hard at all  Food Insecurity: Food Insecurity Present (12/26/2023)   Hunger Vital Sign    Worried About Running Out of Food in the Last Year: Never true    Ran Out of Food in the Last Year: Sometimes true   Transportation Needs: No Transportation Needs (12/26/2023)   PRAPARE - Administrator, Civil Service (Medical): No    Lack of Transportation (Non-Medical): No  Physical Activity: Not on file  Stress: No Stress Concern Present (12/30/2022)   [employer] of Occupational Health - Occupational Stress Questionnaire    Feeling of Stress : Not at all  Social Connections: Unknown (12/30/2022)   Social Connection and Isolation Panel [NHANES]    Frequency of Communication with Friends and Family: More than three times a week    Frequency of Social Gatherings with Friends and Family: Twice a week    Attends Religious Services: Never    Database administrator or Organizations: No    Attends Banker Meetings: Never    Marital Status: Patient declined  Catering manager Violence: Not At Risk (12/26/2023)   Humiliation, Afraid, Rape, and Kick questionnaire    Fear of Current or Ex-Partner: No    Emotionally Abused: No    Physically Abused: No    Sexually Abused: No    Family History  Problem Relation Age of Onset   Healthy Mother    Prostate cancer Father    Colon cancer Father    Diabetes Father    Colon polyps Neg Hx    Esophageal cancer Neg Hx    Rectal cancer Neg Hx    Stomach cancer Neg Hx     Past Surgical History:  Procedure Laterality Date   ANGIOPLASTY  2001   1 stent   KNEE ARTHROSCOPY  2009   right   skin cancer removal  2014   nose and ear    ROS: Review of Systems Negative except as stated above  PHYSICAL EXAM: BP 121/65 (BP Location: Left Arm, Patient Position: Sitting, Cuff Size: Normal)   Pulse 66   Temp 97.9 F (36.6 C) (Oral)   Ht 5\' 7"  (1.702 m)   Wt 131 lb (59.4 kg)   SpO2 97%   BMI 20.52 kg/m   Wt Readings from Last 3 Encounters:  02/08/24 131 lb (59.4 kg)  12/26/23 132 lb (59.9 kg)  08/29/23 134 lb (60.8 kg)    Physical Exam   General appearance - alert, well appearing, and in no distress Mental status - alert,  oriented to person, place, and time     Latest Ref Rng & Units 08/29/2023   10:26 AM 12/23/2022   10:10 AM  CMP  Glucose 70 - 99 mg/dL 782  956   BUN 8 - 27 mg/dL 16  12   Creatinine 2.13 - 1.27 mg/dL 0.86  5.78   Sodium 469 - 144 mmol/L 139  137   Potassium 3.5 - 5.2 mmol/L 4.6  4.6   Chloride 96 - 106 mmol/L 101  99  CO2 20 - 29 mmol/L 23  23   Calcium  8.6 - 10.2 mg/dL 9.7  9.7   Total Protein 6.0 - 8.5 g/dL 7.1  7.0   Total Bilirubin 0.0 - 1.2 mg/dL 0.4  0.5   Alkaline Phos 44 - 121 IU/L 88  82   AST 0 - 40 IU/L 21  17   ALT 0 - 44 IU/L 13  17    Lipid Panel     Component Value Date/Time   CHOL 217 (H) 12/23/2022 1010   TRIG 71 12/23/2022 1010   HDL 51 12/23/2022 1010   CHOLHDL 4.3 12/23/2022 1010   LDLCALC 153 (H) 12/23/2022 1010    CBC    Component Value Date/Time   WBC 7.7 08/29/2023 1026   RBC 5.13 08/29/2023 1026   HGB 16.7 08/29/2023 1026   HCT 50.6 08/29/2023 1026   PLT 225 08/29/2023 1026   MCV 99 (H) 08/29/2023 1026   MCH 32.6 08/29/2023 1026   MCHC 33.0 08/29/2023 1026   RDW 13.8 08/29/2023 1026    ASSESSMENT AND PLAN: 1. Unintended weight loss (Primary) Weight stable with slight decrease. Reduced Monster drinks and unhealthy snacks. Balanced meals improved. - Encourage him to continue trying to eat healthy and to eat them for 3 meals a day  2. Prediabetes He was in prediabetes range when last checked in December.  Again discussed the importance of healthy eating habits to prevent progression to full diabetes  3. Erectile dysfunction due to arterial disease (HCC) Pt advised of possible side effects of this medication including prolong erection, flushing, headaches, stuff nose and sudden vision and hearing changes.  Pt told to be seen in ER if he has erection lasting longer than 3-4 hrs, or if he has sudden vision changes or hearing loss.  - sildenafil (VIAGRA) 50 MG tablet; Take 1 tablet p.o. half hour to 1 hour prior to intercourse.  Limit use to 1  tab/24 hr period.  Dispense: 20 tablet; Refill: 1  4. Screening for lung cancer We had submitted referral for lung cancer screening to [facility] imaging on last visit.  Patient states he has not been called.  However we have had issues when we call him that the call would go directly to voicemail and it would never ring on his hand.  He is agreeable to me providing him the phone number for [facility] imaging so that he can call and schedule the appointment.  The information was placed on his discharge summary.   Patient was given the opportunity to ask questions.  Patient verbalized understanding of the plan and was able to repeat key elements of the plan.   This documentation was completed using Paediatric nurse.  Any transcriptional errors are unintentional.  No orders of the defined types were placed in this encounter.    Requested Prescriptions   Signed Prescriptions Disp Refills   sildenafil (VIAGRA) 50 MG tablet 20 tablet 1    Sig: Take 1 tablet p.o. half hour to 1 hour prior to intercourse.  Limit use to 1 tab/24 hr period.    Return in about 4 months (around 06/10/2024) for Give in person appt for Medicare Wellness Visit in 2-4 wks with LPN.  Concetta Dee, MD, FACP

## 2024-02-08 NOTE — Patient Instructions (Addendum)
 I have submitted a referral to [facility] imaging for you to have CAT scans of the lung for lung cancer screening.  They may have tried to reach you unsuccessfully.  Please give them a call to schedule: [phone]  I have sent a prescription to your pharmacy for the Viagra possible side effects of this medication including prolong erection, flushing, headaches, stuff nose and sudden vision and hearing changes.  Be seen in ER if you have erection lasting longer than 3-4 hrs, or if you have sudden vision changes or hearing loss.  VISIT SUMMARY:  During your follow-up visit, we reviewed your blood pressure, weight management, and discussed your prediabetes status. Your blood pressure has improved, and you have made positive changes to your diet by reducing Monster drinks and unhealthy snacks. We also talked about the importance of balanced meals and regular screenings.  YOUR PLAN:  -UNINTENDED WEIGHT LOSS: Your weight has remained stable with a slight decrease. You have reduced your intake of Monster drinks and unhealthy snacks, and your meals are more balanced. Continue to focus on balanced meals and healthy snacks.  -PREDIABETES: You are still in the prediabetes range, which means your blood sugar levels are higher than normal but not high enough to be classified as diabetes. You have reduced your Monster drink consumption and are aware of diabetes symptoms. It's important to increase your fiber intake by eating more green leafy vegetables and fruits.  -ELEVATED BLOOD PRESSURE: Your blood pressure is now controlled at 121/65 mmHg, thanks to your lifestyle changes. Continue with your current dietary modifications to maintain this improvement.  -GENERAL HEALTH MAINTENANCE: You are overdue for your Medicare wellness visit, which is important for routine screenings, vaccines, and monitoring for conditions like dementia. Please schedule this visit with the nursing staff.  INSTRUCTIONS:  Please  contact [facility] to schedule your lung cancer screening. We will also need to schedule a follow-up visit in 4-5 months.

## 2024-06-07 ENCOUNTER — Telehealth: Payer: Self-pay | Admitting: Internal Medicine

## 2024-06-07 NOTE — Telephone Encounter (Signed)
Confirmed appt for 9/22

## 2024-06-10 ENCOUNTER — Encounter: Payer: Self-pay | Admitting: Internal Medicine

## 2024-06-10 ENCOUNTER — Ambulatory Visit: Attending: Internal Medicine | Admitting: Internal Medicine

## 2024-06-10 VITALS — BP 125/75 | HR 60 | Temp 98.0°F | Ht 67.0 in | Wt 131.0 lb

## 2024-06-10 DIAGNOSIS — F17290 Nicotine dependence, other tobacco product, uncomplicated: Secondary | ICD-10-CM | POA: Diagnosis not present

## 2024-06-10 DIAGNOSIS — R7303 Prediabetes: Secondary | ICD-10-CM

## 2024-06-10 DIAGNOSIS — Z79899 Other long term (current) drug therapy: Secondary | ICD-10-CM

## 2024-06-10 DIAGNOSIS — N521 Erectile dysfunction due to diseases classified elsewhere: Secondary | ICD-10-CM | POA: Diagnosis not present

## 2024-06-10 DIAGNOSIS — R413 Other amnesia: Secondary | ICD-10-CM

## 2024-06-10 DIAGNOSIS — Z122 Encounter for screening for malignant neoplasm of respiratory organs: Secondary | ICD-10-CM

## 2024-06-10 DIAGNOSIS — Z2821 Immunization not carried out because of patient refusal: Secondary | ICD-10-CM

## 2024-06-10 DIAGNOSIS — I779 Disorder of arteries and arterioles, unspecified: Secondary | ICD-10-CM | POA: Diagnosis not present

## 2024-06-10 DIAGNOSIS — F17200 Nicotine dependence, unspecified, uncomplicated: Secondary | ICD-10-CM

## 2024-06-10 DIAGNOSIS — I2510 Atherosclerotic heart disease of native coronary artery without angina pectoris: Secondary | ICD-10-CM | POA: Diagnosis not present

## 2024-06-10 DIAGNOSIS — Z955 Presence of coronary angioplasty implant and graft: Secondary | ICD-10-CM | POA: Diagnosis not present

## 2024-06-10 MED ORDER — SILDENAFIL CITRATE 50 MG PO TABS: ORAL_TABLET | ORAL | 1 refills | Status: AC

## 2024-06-10 MED ORDER — ROSUVASTATIN CALCIUM 10 MG PO TABS: 10.0000 mg | ORAL_TABLET | Freq: Every day | ORAL | 3 refills | Status: DC

## 2024-06-10 NOTE — Patient Instructions (Addendum)
 PtVISIT SUMMARY:  Call [facility] to schedule your lung CT scan.  Ph #: Phone: [phone]   Today, you came in for a follow-up on your chronic medical conditions, including heart disease, high cholesterol, prediabetes, and memory issues. We also discussed your tobacco use and erectile dysfunction.  YOUR PLAN:  -ATHEROSCLEROTIC HEART DISEASE WITH HYPERLIPIDEMIA: This condition involves the buildup of fats, cholesterol, and other substances in and on your artery walls, which can restrict blood flow. Your LDL cholesterol level is elevated at 153 mg/dL. We will switch your medication to rosuvastatin  due to previous muscle aches with atorvastatin . Please report any muscle cramps or aches. Continue taking aspirin as prescribed. We have also ordered a lipid panel and kidney and liver function tests to monitor your condition.  -TOBACCO USE DISORDER: This refers to your ongoing use of tobacco, which can harm your health. You are currently smoking four cigars daily. We strongly encourage you to quit smoking and have provided contact information for a lung cancer screening CT scan.  -MEMORY LOSS, UNSPECIFIED: You have reported issues with short-term memory, such as forgetting conversations and how to operate familiar appliances. We will perform a mini mental status exam to evaluate your memory and may refer you to a neurologist based on the results.  -PREDIABETES: Prediabetes means your blood sugar levels are higher than normal but not yet high enough to be diagnosed as diabetes. You have made dietary changes and maintained a stable weight. We will order blood tests to assess your current status.  -ERECTILE DYSFUNCTION: This condition involves difficulty in getting or keeping an erection. You have been using sildenafil  with satisfactory results. We will refill your prescription for sildenafil .  INSTRUCTIONS:  Please follow up for the blood tests and the mini mental status exam as discussed.  Additionally, contact the provided number to schedule your lung cancer screening CT scan.

## 2024-06-10 NOTE — Progress Notes (Signed)
 Patient ID: Caleb Le, male    DOB: 04/14/1951  MRN: [id number]  CC: Follow-up (Follow-up. Med refill. Suellen not taking atorvastatin /Requesting referral to neuro - frequent episodes of forgetfulness. /No to flu. Discuss shingles vax.)   Subjective: Caleb Le is a 73 y.o. male who presents for chronic ds management. His concerns today include:  Patient with history of cigar smoker, skin cancer, HL, history of MI/ stent placed 2013, prediabetes   Discussed the use of AI scribe software for clinical note transcription with the patient, who gave verbal consent to proceed.  History of Present Illness Caleb Le is a 73 year old male with heart disease and hyperlipidemia who presents for follow-up on his chronic medical conditions.  CAD/HL: He has a history of heart disease and hyperlipidemia. He previously discontinued atorvastatin  due to muscle aches. His last cholesterol check in the spring of the previous year showed an elevated LDL level of 153 mg/dL. He takes aspirin regularly and experiences occasional sharp chest pains.  PreDM: He has a history of prediabetes and has made dietary changes to avoid fatty and fried foods, though he occasionally consumes [place] fries. He is due for a recheck of his blood glucose levels.  Tob dep: He continues to smoke cigars, approximately four per day, despite family members encouraging him to quit. He has tried Chantix  in the past but finds it expensive and believes he can quit on his own now that he no longer drives frequently.  He reports issues with short-term memory, such as forgetting conversations and how to operate familiar appliances like his dryer. He does not experience problems with long-term memory, getting lost, or safety concerns like leaving the stove on. He finds these memory issues frustrating.  ED: He is currently taking sildenafil  for erectile dysfunction and requires a refill.  HM: He has not yet undergone lung  cancer screening, which was previously ordered, and does not recall if [facility] had called to schedule. He has not received a flu shot or shingles vaccine. Declines flu and wants to researc more on Shingrix    Patient Active Problem List   Diagnosis Date Noted   Tobacco dependence 12/23/2022   Inguinal hernia of left side without obstruction or gangrene 12/23/2022   Coronary artery disease involving native coronary artery of native heart without angina pectoris 12/23/2022   Constipation 12/23/2022   Pneumococcal vaccination declined 12/23/2022   Tetanus, diphtheria, and acellular pertussis (Tdap) vaccination declined 12/23/2022     Current Outpatient Medications on File Prior to Visit  Medication Sig Dispense Refill   aspirin 81 MG chewable tablet Chew 81 mg by mouth daily.       polyethylene glycol powder (GLYCOLAX /MIRALAX ) 17 GM/SCOOP powder Take 17 g by mouth daily as needed. 3350 g 1   Varenicline  Tartrate, Starter, (CHANTIX  STARTING MONTH PAK) 0.5 MG X 11 & 1 MG X 42 TBPK Use as directed (Patient not taking: Reported on 06/10/2024) 53 each 0   No current facility-administered medications on file prior to visit.    No Known Allergies  Social History   Socioeconomic History   Marital status: Divorced    Spouse name: Not on file   Number of children: Not on file   Years of education: Not on file   Highest education level: Not on file  Occupational History   Not on file  Tobacco Use   Smoking status: Former    Current packs/day: 0.00    Types: Cigarettes  Quit date: 2024    Years since quitting: 1.7   Smokeless tobacco: Never  Vaping Use   Vaping status: Never Used  Substance and Sexual Activity   Alcohol use: No    Comment: stopped drinking alcohol November 2013   Drug use: No   Sexual activity: Not on file  Other Topics Concern   Not on file  Social History Narrative   Not on file   Social Drivers of Health   Financial Resource Strain: Low Risk   (12/30/2022)   Overall Financial Resource Strain (CARDIA)    Difficulty of Paying Living Expenses: Not hard at all  Food Insecurity: Food Insecurity Present (12/26/2023)   Hunger Vital Sign    Worried About Running Out of Food in the Last Year: Never true    Ran Out of Food in the Last Year: Sometimes true  Transportation Needs: No Transportation Needs (12/26/2023)   PRAPARE - Administrator, Civil Service (Medical): No    Lack of Transportation (Non-Medical): No  Physical Activity: Not on file  Stress: No Stress Concern Present (12/30/2022)   [employer] of Occupational Health - Occupational Stress Questionnaire    Feeling of Stress : Not at all  Social Connections: Unknown (12/30/2022)   Social Connection and Isolation Panel    Frequency of Communication with Friends and Family: More than three times a week    Frequency of Social Gatherings with Friends and Family: Twice a week    Attends Religious Services: Never    Database administrator or Organizations: No    Attends Banker Meetings: Never    Marital Status: Patient declined  Catering manager Violence: Not At Risk (12/26/2023)   Humiliation, Afraid, Rape, and Kick questionnaire    Fear of Current or Ex-Partner: No    Emotionally Abused: No    Physically Abused: No    Sexually Abused: No    Family History  Problem Relation Age of Onset   Healthy Mother    Prostate cancer Father    Colon cancer Father    Diabetes Father    Colon polyps Neg Hx    Esophageal cancer Neg Hx    Rectal cancer Neg Hx    Stomach cancer Neg Hx     Past Surgical History:  Procedure Laterality Date   ANGIOPLASTY  2001   1 stent   KNEE ARTHROSCOPY  2009   right   skin cancer removal  2014   nose and ear    ROS: Review of Systems Negative except as stated above  PHYSICAL EXAM: BP 125/75 (BP Location: Left Arm, Patient Position: Sitting, Cuff Size: Normal)   Pulse 60   Temp 98 F (36.7 C) (Oral)   Ht 5' 7  (1.702 m)   Wt 131 lb (59.4 kg)   SpO2 97%   BMI 20.52 kg/m   Wt Readings from Last 3 Encounters:  06/10/24 131 lb (59.4 kg)  02/08/24 131 lb (59.4 kg)  12/26/23 132 lb (59.9 kg)    Physical Exam   General appearance - alert, well appearing, and in no distress Mental status - normal mood, behavior, speech, dress, motor activity, and thought processes Neck - supple, no significant adenopathy Chest - clear to auscultation, no wheezes, rales or rhonchi, symmetric air entry Heart - normal rate, regular rhythm, normal S1, S2, no murmurs, rubs, clicks or gallops Extremities - peripheral pulses normal, no pedal edema, no clubbing or cyanosis  Mcog: scored 1/5 (Unable to  draw and set clock to requested time and recalled only 1 of 3 items after 5 mins)    06/10/2024   11:44 AM 12/30/2022    2:51 PM  MMSE - Mini Mental State Exam  Not completed:  Unable to complete  Orientation to time 5   Orientation to Place 5   Registration 3   Attention/ Calculation 0   Recall 1   Language- name 2 objects 2   Language- repeat 1   Language- follow 3 step command 3   Language- read & follow direction 1   Write a sentence 1   Copy design 1   Total score 23        Latest Ref Rng & Units 08/29/2023   10:26 AM 12/23/2022   10:10 AM  CMP  Glucose 70 - 99 mg/dL 889  890   BUN 8 - 27 mg/dL 16  12   Creatinine 9.23 - 1.27 mg/dL 9.14  9.09   Sodium 865 - 144 mmol/L 139  137   Potassium 3.5 - 5.2 mmol/L 4.6  4.6   Chloride 96 - 106 mmol/L 101  99   CO2 20 - 29 mmol/L 23  23   Calcium  8.6 - 10.2 mg/dL 9.7  9.7   Total Protein 6.0 - 8.5 g/dL 7.1  7.0   Total Bilirubin 0.0 - 1.2 mg/dL 0.4  0.5   Alkaline Phos 44 - 121 IU/L 88  82   AST 0 - 40 IU/L 21  17   ALT 0 - 44 IU/L 13  17    Lipid Panel     Component Value Date/Time   CHOL 217 (H) 12/23/2022 1010   TRIG 71 12/23/2022 1010   HDL 51 12/23/2022 1010   CHOLHDL 4.3 12/23/2022 1010   LDLCALC 153 (H) 12/23/2022 1010    CBC    Component  Value Date/Time   WBC 7.7 08/29/2023 1026   RBC 5.13 08/29/2023 1026   HGB 16.7 08/29/2023 1026   HCT 50.6 08/29/2023 1026   PLT 225 08/29/2023 1026   MCV 99 (H) 08/29/2023 1026   MCH 32.6 08/29/2023 1026   MCHC 33.0 08/29/2023 1026   RDW 13.8 08/29/2023 1026    ASSESSMENT AND PLAN: 1. Coronary artery disease involving native coronary artery of native heart without angina pectoris (Primary) Continue ASA. Advise need for statin. Reports some muscle aches with Lipitor in past but will to try Crestor  instead. - Lipid panel - Comprehensive metabolic panel with GFR - CBC - rosuvastatin  (CRESTOR ) 10 MG tablet; Take 1 tablet (10 mg total) by mouth daily.  Dispense: 90 tablet; Refill: 3  2. Tobacco dependence Advised to quit.  He will try quitting on his own when he is ready. Does not want to retry Chantix .  3. 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 half hour to 1 hour prior to intercourse.  Limit use to 1 tab/24 hr period.  Dispense: 20 tablet; Refill: 1  4. Prediabetes Commended him on trying to eat healthy. Encouraged to continue - Hemoglobin A1c  5. Influenza vaccination declined Recommended. Pt declined  6. Memory changes With abn MiniCog and MMSE suggesting early dementia. Will get him in with neurology for further eval before discussing starting on Aricept - Vitamin B12 - Folate  7. Screening for lung cancer Given phone # to call [facility] to schedule CT that was ordered on last visit  Patient  was given the opportunity to ask questions.  Patient verbalized understanding of the plan and was able to repeat key elements of the plan.   This documentation was completed using Paediatric nurse.  Any transcriptional errors are unintentional.  Orders Placed This Encounter  Procedures   Lipid panel   Comprehensive metabolic panel with GFR   CBC   Hemoglobin A1c   Vitamin B12    Folate   Ambulatory referral to Neurology     Requested Prescriptions   Signed Prescriptions Disp Refills   sildenafil  (VIAGRA ) 50 MG tablet 20 tablet 1    Sig: Take 1 tablet p.o. half hour to 1 hour prior to intercourse.  Limit use to 1 tab/24 hr period.   rosuvastatin  (CRESTOR ) 10 MG tablet 90 tablet 3    Sig: Take 1 tablet (10 mg total) by mouth daily.    Return in about 4 months (around 10/10/2024) for If they can schedule in person with LPN.Then with me in 4 mths.SABRA Barnie Louder, MD, FACP

## 2024-06-11 ENCOUNTER — Other Ambulatory Visit

## 2024-06-12 ENCOUNTER — Encounter: Payer: Self-pay | Admitting: Physician Assistant

## 2024-06-12 ENCOUNTER — Ambulatory Visit: Attending: Family Medicine

## 2024-06-12 DIAGNOSIS — R413 Other amnesia: Secondary | ICD-10-CM | POA: Diagnosis not present

## 2024-06-12 DIAGNOSIS — R7303 Prediabetes: Secondary | ICD-10-CM | POA: Diagnosis not present

## 2024-06-13 ENCOUNTER — Ambulatory Visit: Payer: Self-pay | Admitting: Internal Medicine

## 2024-06-13 LAB — COMPREHENSIVE METABOLIC PANEL WITH GFR
ALT: 11 IU/L (ref 0–44)
AST: 15 IU/L (ref 0–40)
Albumin: 4.1 g/dL (ref 3.8–4.8)
Alkaline Phosphatase: 89 IU/L (ref 47–123)
BUN/Creatinine Ratio: 18 (ref 10–24)
BUN: 15 mg/dL (ref 8–27)
Bilirubin Total: 0.4 mg/dL (ref 0.0–1.2)
CO2: 21 mmol/L (ref 20–29)
Calcium: 9 mg/dL (ref 8.6–10.2)
Chloride: 101 mmol/L (ref 96–106)
Creatinine, Ser: 0.82 mg/dL (ref 0.76–1.27)
Globulin, Total: 2.7 g/dL (ref 1.5–4.5)
Glucose: 106 mg/dL — ABNORMAL HIGH (ref 70–99)
Potassium: 4.3 mmol/L (ref 3.5–5.2)
Sodium: 139 mmol/L (ref 134–144)
Total Protein: 6.8 g/dL (ref 6.0–8.5)
eGFR: 94 mL/min/1.73 (ref >59)

## 2024-06-13 LAB — CBC
Hematocrit: 48.3 % (ref 37.5–51.0)
Hemoglobin: 16.4 g/dL (ref 13.0–17.7)
MCH: 32.5 pg (ref 26.6–33.0)
MCHC: 34 g/dL (ref 31.5–35.7)
MCV: 96 fL (ref 79–97)
Platelets: 194 x10E3/uL (ref 150–450)
RBC: 5.04 x10E6/uL (ref 4.14–5.80)
RDW: 13.7 % (ref 11.6–15.4)
WBC: 6.5 x10E3/uL (ref 3.4–10.8)

## 2024-06-13 LAB — LIPID PANEL
Chol/HDL Ratio: 3.7 ratio (ref 0.0–5.0)
Cholesterol, Total: 186 mg/dL (ref 100–199)
HDL: 50 mg/dL (ref >39)
LDL Chol Calc (NIH): 124 mg/dL — ABNORMAL HIGH (ref 0–99)
Triglycerides: 61 mg/dL (ref 0–149)
VLDL Cholesterol Cal: 12 mg/dL (ref 5–40)

## 2024-06-13 LAB — VITAMIN B12: Vitamin B-12: 661 pg/mL (ref 232–1245)

## 2024-06-13 LAB — FOLATE: Folate: 7.7 ng/mL (ref >3.0)

## 2024-06-13 LAB — HEMOGLOBIN A1C
Est. average glucose Bld gHb Est-mCnc: 123 mg/dL
Hgb A1c MFr Bld: 5.9 % — ABNORMAL HIGH (ref 4.8–5.6)

## 2024-07-09 ENCOUNTER — Ambulatory Visit: Attending: Internal Medicine

## 2024-07-09 VITALS — Ht 67.0 in | Wt 132.8 lb

## 2024-07-09 DIAGNOSIS — Z0000 Encounter for general adult medical examination without abnormal findings: Secondary | ICD-10-CM

## 2024-07-09 NOTE — Patient Instructions (Signed)
 Mr. Caleb Le,  Thank you for taking the time for your Medicare Wellness Visit. I appreciate your continued commitment to your health goals. Please review the care plan we discussed, and feel free to reach out if I can assist you further.  Medicare recommends these wellness visits once per year to help you and your care team stay ahead of potential health issues. These visits are designed to focus on prevention, allowing your provider to concentrate on managing your acute and chronic conditions during your regular appointments.  Please note that Annual Wellness Visits do not include a physical exam. Some assessments may be limited, especially if the visit was conducted virtually. If needed, we may recommend a separate in-person follow-up with your provider.  Ongoing Care Seeing your primary care provider every 3 to 6 months helps us  monitor your health and provide consistent, personalized care.   Referrals If a referral was made during today's visit and you haven't received any updates within two weeks, please contact the referred provider directly to check on the status.  Recommended Screenings:  Health Maintenance  Topic Date Due   COVID-19 Vaccine (4 - 2025-26 season) 05/20/2024   Zoster (Shingles) Vaccine (1 of 2) 09/26/2024*   Flu Shot  12/17/2024*   Pneumococcal Vaccine for age over 74 (1 of 2 - PCV) 12/25/2024*   Medicare Annual Wellness Visit  07/09/2025   Colon Cancer Screening  02/28/2028   Hepatitis C Screening  Completed   Meningitis B Vaccine  Aged Out   DTaP/Tdap/Td vaccine  Discontinued  *Topic was postponed. The date shown is not the original due date.       07/09/2024    4:56 PM  Advanced Directives  Does Patient Have a Medical Advance Directive? No  Would patient like information on creating a medical advance directive? No - Patient declined   Advance Care Planning is important because it: Ensures you receive medical care that aligns with your values, goals, and  preferences. Provides guidance to your family and loved ones, reducing the emotional burden of decision-making during critical moments.  Vision: Annual vision screenings are recommended for early detection of glaucoma, cataracts, and diabetic retinopathy. These exams can also reveal signs of chronic conditions such as diabetes and high blood pressure.  Dental: Annual dental screenings help detect early signs of oral cancer, gum disease, and other conditions linked to overall health, including heart disease and diabetes.  Please see the attached documents for additional preventive care recommendations.

## 2024-07-09 NOTE — Progress Notes (Cosign Needed Addendum)
 Subjective:   Caleb Le is a 73 y.o. who presents for a Medicare Wellness preventive visit.  As a reminder, Annual Wellness Visits don't include a physical exam, and some assessments may be limited, especially if this visit is performed virtually. We may recommend an in-person follow-up visit with your provider if needed.  Visit Complete: In person  Persons Participating in Visit: Patient.  AWV Questionnaire: No: Patient Medicare AWV questionnaire was not completed prior to this visit.  Cardiac Risk Factors include: advanced age (>60men, >60 women);dyslipidemia;male gender;sedentary lifestyle     Objective:    Today's Vitals   07/09/24 1643  Weight: 132 lb 12.8 oz (60.2 kg)  Height: 5' 7 (1.702 m)  PainSc: 0-No pain   Body mass index is 20.8 kg/m.     07/09/2024    4:56 PM 12/30/2022    2:45 PM  Advanced Directives  Does Patient Have a Medical Advance Directive? No Yes  Type of Advance Directive  Living will  Would patient like information on creating a medical advance directive? No - Patient declined     Current Medications (verified) Outpatient Encounter Medications as of 07/09/2024  Medication Sig   aspirin 81 MG chewable tablet Chew 81 mg by mouth daily.     polyethylene glycol powder (GLYCOLAX /MIRALAX ) 17 GM/SCOOP powder Take 17 g by mouth daily as needed.   rosuvastatin  (CRESTOR ) 10 MG tablet Take 1 tablet (10 mg total) by mouth daily.   sildenafil  (VIAGRA ) 50 MG tablet Take 1 tablet p.o. half hour to 1 hour prior to intercourse.  Limit use to 1 tab/24 hr period.   Varenicline  Tartrate, Starter, (CHANTIX  STARTING MONTH PAK) 0.5 MG X 11 & 1 MG X 42 TBPK Use as directed (Patient not taking: Reported on 06/10/2024)   No facility-administered encounter medications on file as of 07/09/2024.    Allergies (verified) Patient has no known allergies.   History: Past Medical History:  Diagnosis Date   Arthritis    Hypercholesteremia    MI (myocardial  infarction) (HCC) 2001   Skin cancer 2014   ear   Past Surgical History:  Procedure Laterality Date   ANGIOPLASTY  2001   1 stent   KNEE ARTHROSCOPY  2009   right   skin cancer removal  2014   nose and ear   Family History  Problem Relation Age of Onset   Healthy Mother    Prostate cancer Father    Colon cancer Father    Diabetes Father    Colon polyps Neg Hx    Esophageal cancer Neg Hx    Rectal cancer Neg Hx    Stomach cancer Neg Hx    Social History   Socioeconomic History   Marital status: Divorced    Spouse name: Not on file   Number of children: Not on file   Years of education: Not on file   Highest education level: Not on file  Occupational History   Not on file  Tobacco Use   Smoking status: Former    Current packs/day: 0.00    Types: Cigarettes    Quit date: 2024    Years since quitting: 1.8   Smokeless tobacco: Never  Vaping Use   Vaping status: Never Used  Substance and Sexual Activity   Alcohol use: No    Comment: stopped drinking alcohol November 2013   Drug use: No   Sexual activity: Not on file  Other Topics Concern   Not on file  Social History  Narrative   Not on file   Social Drivers of Health   Financial Resource Strain: Low Risk  (12/30/2022)   Overall Financial Resource Strain (CARDIA)    Difficulty of Paying Living Expenses: Not hard at all  Food Insecurity: No Food Insecurity (07/09/2024)   Hunger Vital Sign    Worried About Running Out of Food in the Last Year: Never true    Ran Out of Food in the Last Year: Never true  Transportation Needs: No Transportation Needs (07/09/2024)   PRAPARE - Administrator, Civil Service (Medical): No    Lack of Transportation (Non-Medical): No  Physical Activity: Inactive (07/09/2024)   Exercise Vital Sign    Days of Exercise per Week: 0 days    Minutes of Exercise per Session: 0 min  Stress: No Stress Concern Present (07/09/2024)   [employer] of Occupational Health -  Occupational Stress Questionnaire    Feeling of Stress: Not at all  Social Connections: Unknown (07/09/2024)   Social Connection and Isolation Panel    Frequency of Communication with Friends and Family: More than three times a week    Frequency of Social Gatherings with Friends and Family: Twice a week    Attends Religious Services: Never    Database administrator or Organizations: No    Attends Engineer, structural: Never    Marital Status: Patient declined    Tobacco Counseling Counseling given: Not Answered    Clinical Intake:  Pre-visit preparation completed: Yes  Pain : No/denies pain Pain Score: 0-No pain     BMI - recorded: 20.8 Nutritional Status: BMI of 19-24  Normal Nutritional Risks: None Diabetes: No  Lab Results  Component Value Date   HGBA1C 5.9 (H) 06/12/2024   HGBA1C 5.9 (H) 08/29/2023     How often do you need to have someone help you when you read instructions, pamphlets, or other written materials from your doctor or pharmacy?: 1 - Never  Interpreter Needed?: No  Information entered by :: Simya Tercero N. Arayla Kruschke, LPN.   Activities of Daily Living     07/09/2024    5:03 PM  In your present state of health, do you have any difficulty performing the following activities:  Hearing? 0  Vision? 0  Difficulty concentrating or making decisions? 1  Walking or climbing stairs? 0  Dressing or bathing? 0  Doing errands, shopping? 0  Preparing Food and eating ? N  Using the Toilet? N  In the past six months, have you accidently leaked urine? Y  Do you have problems with loss of bowel control? N  Managing your Medications? N  Managing your Finances? N  Housekeeping or managing your Housekeeping? N    Patient Care Team: Vicci Barnie NOVAK, MD as PCP - General (Internal Medicine)  I have updated your Care Teams any recent Medical Services you may have received from other providers in the past year.     Assessment:   This is a routine  wellness examination for Caleb Le.  Hearing/Vision screen Hearing Screening - Comments:: Adequate hearing. Vision Screening - Comments:: Wears rx glasses - up to date with routine eye exams.  Patient could not remember name of eye doctor.     Goals Addressed             This Visit's Progress    07/09/2024: To remain independent and stay healthy.         Depression Screen     07/09/2024  4:58 PM 06/10/2024   10:58 AM 12/26/2023    9:31 AM 08/29/2023    9:34 AM 04/28/2023    8:49 AM 12/30/2022    2:46 PM 12/23/2022    9:58 AM  PHQ 2/9 Scores  PHQ - 2 Score 0 0 1  0 0 0  PHQ- 9 Score 0 1 4    2   Exception Documentation    Patient refusal       Fall Risk     07/09/2024    4:58 PM 04/28/2023    8:49 AM 12/30/2022    2:46 PM 12/23/2022    9:00 AM  Fall Risk   Falls in the past year? 0 0 0 1  Number falls in past yr: 0 0 0 0  Injury with Fall? 0 0 0 0  Risk for fall due to : No Fall Risks No Fall Risks No Fall Risks History of fall(s)  Follow up Falls evaluation completed       MEDICARE RISK AT HOME:  Medicare Risk at Home Any stairs in or around the home?: No If so, are there any without handrails?: No Home free of loose throw rugs in walkways, pet beds, electrical cords, etc?: Yes Adequate lighting in your home to reduce risk of falls?: Yes Life alert?: No Use of a cane, walker or w/c?: No Grab bars in the bathroom?: No Shower chair or bench in shower?: No Elevated toilet seat or a handicapped toilet?: No  TIMED UP AND GO:  Was the test performed?  Yes, patient walked normal with no assistive device.  Cognitive Function: Declined/Normal: No cognitive concerns noted by patient or family. Patient alert, oriented, able to answer questions appropriately and recall recent events. No signs of memory loss or confusion.    07/09/2024    4:58 PM 06/10/2024   11:44 AM 12/30/2022    2:51 PM  MMSE - Mini Mental State Exam  Not completed:   Unable to complete  Orientation to  time 5 5   Orientation to Place 5 5   Registration 3 3   Attention/ Calculation 0 0   Recall 1 1   Language- name 2 objects 2 2   Language- repeat 1 1   Language- follow 3 step command 3 3   Language- read & follow direction 1 1   Write a sentence 1 1   Copy design 1 1   Total score 23 23         12/30/2022    2:51 PM  6CIT Screen  What Year? --    Immunizations Immunization History  Administered Date(s) Administered   PFIZER(Purple Top)SARS-COV-2 Vaccination 11/10/2019, 12/04/2019, 09/04/2020    Screening Tests Health Maintenance  Topic Date Due   COVID-19 Vaccine (4 - 2025-26 season) 05/20/2024   Zoster Vaccines- Shingrix (1 of 2) 09/26/2024 (Originally 05/16/1970)   Influenza Vaccine  12/17/2024 (Originally 04/19/2024)   Pneumococcal Vaccine: 50+ Years (1 of 2 - PCV) 12/25/2024 (Originally 05/16/1970)   Medicare Annual Wellness (AWV)  07/09/2025   Colonoscopy  02/28/2028   Hepatitis C Screening  Completed   Meningococcal B Vaccine  Aged Out   DTaP/Tdap/Td  Discontinued    Health Maintenance Items Addressed: Vaccines Due: Covid-19 vaccines  Additional Screening:  Vision Screening: Recommended annual ophthalmology exams for early detection of glaucoma and other disorders of the eye. Is the patient up to date with their annual eye exam?  Yes  Who is the provider or what is the name of  the office in which the patient attends annual eye exams? Poatient could not remember the name  Dental Screening: Recommended annual dental exams for proper oral hygiene  Community Resource Referral / Chronic Care Management: CRR required this visit?  No   CCM required this visit?  No   Plan:    I have personally reviewed and noted the following in the patient's chart:   Medical and social history Use of alcohol, tobacco or illicit drugs  Current medications and supplements including opioid prescriptions. Patient is not currently taking opioid prescriptions. Functional  ability and status Nutritional status Physical activity Advanced directives List of other physicians Hospitalizations, surgeries, and ER visits in previous 12 months Vitals Screenings to include cognitive, depression, and falls Referrals and appointments  In addition, I have reviewed and discussed with patient certain preventive protocols, quality metrics, and best practice recommendations. A written personalized care plan for preventive services as well as general preventive health recommendations were provided to patient.   Roz LOISE Fuller, LPN   [id number]   After Visit Summary: (In Person-Printed) AVS printed and given to the patient  Notes: Nothing significant to report at this time.

## 2024-08-21 ENCOUNTER — Ambulatory Visit: Admitting: Physician Assistant

## 2024-08-21 ENCOUNTER — Ambulatory Visit

## 2024-10-08 ENCOUNTER — Ambulatory Visit: Payer: Self-pay

## 2024-10-08 NOTE — Telephone Encounter (Signed)
 FYI Only or Action Required?: Action required by provider: request for appointment.  Patient was last seen in primary care on 06/10/2024 by Vicci Barnie NOVAK, MD.  Called Nurse Triage reporting Weight Loss and reduced appetite.  Symptoms began 2-3 months.  Interventions attempted: Rest, hydration, or home remedies.  Symptoms are: stable.  Triage Disposition: See Physician Within 24 Hours  Patient/caregiver understands and will follow disposition?: No, wishes to speak with PCP             Reason for Disposition  [1] Night sweats occur (e.g., drenching sweat that occurs at night and has to change bed clothes or bed sheets) AND [2] cause unknown  Answer Assessment - Initial Assessment Questions Patient spouse (consent not on file patient provided verbal consent for this RN to speak with spouse and schedule an appointment ) patients spouse reports  2 months losing weight , decreased appetite , needing to use bathroom for bowel movements after eating not vomiting. Thought was cholesterol medication stopped this, still appetite is not back to where it was in the past. Likes to eat but doesn't have the appetite. Mentions the  gets cold sometimes where he is shaking cold shaking it didn't start yesterday has been going on , yesterday had to hold him due to shaking 5 minutes. Has been more tried less energy but relates to dementia progressing.  Has night sweats but they stopped. Spouse requesting appointment for patient PCP only this RN not able to find any availability this week/month , patients spouse requesting call back to schedule in meantime will monitor and call back concerns    1. MAIN CONCERN: What is your main concern today?     Weight loss,. Fatigue dementia progression , reduced appetite , shaking chills that comes and goes , hard to get all symptoms due to patient dementia concerns but monitoring closely    4. CAUSE: What do you think is causing the weight loss? (e.g.,  depression, anxiety, medicine side effect, pain, trouble swallowing, substance or alcohol use problem, eating disorder)     Thought was medication related for cholesterol bt stopped that and still weight loss possibly y main concern is reduced appetite   6. HEART FAILURE TREATMENT: Do you have heart failure? If Yes, ask: Have you taken new or extra water pills (diuretics) recently? (e.g., furosemide; bumetanide). What is your target weight?     No evidence of that per chart review  7. OTHER SYMPTOMS: Do you have any other symptoms? (e.g., anxiety or depression, blood in stool, breathing difficulty, diarrhea, fever, trouble swallowing)     Spouse denies patient with reports of chest pain, shortness of breath, vomiting, dizziness, blood in stools, abdominal pain  Protocols used: Weight Loss - Unintended-A-AH   Message from Victoria B sent at 10/08/2024  2:03 PM EST  Reason for Triage: patient has lost a lot of weight for 2 months when was on cholesterol med, family has taken him off and now he is eating better, but still concerned about the weight that has been lost

## 2024-10-08 NOTE — Telephone Encounter (Signed)
 Noted.  Patient has upcoming appointment to discuss concerns.

## 2024-10-10 ENCOUNTER — Encounter: Payer: Self-pay | Admitting: Internal Medicine

## 2024-10-10 ENCOUNTER — Ambulatory Visit: Attending: Internal Medicine | Admitting: Internal Medicine

## 2024-10-10 VITALS — BP 114/60 | HR 58 | Ht 67.0 in | Wt 124.2 lb

## 2024-10-10 DIAGNOSIS — E785 Hyperlipidemia, unspecified: Secondary | ICD-10-CM | POA: Diagnosis not present

## 2024-10-10 DIAGNOSIS — R413 Other amnesia: Secondary | ICD-10-CM | POA: Diagnosis not present

## 2024-10-10 DIAGNOSIS — F17200 Nicotine dependence, unspecified, uncomplicated: Secondary | ICD-10-CM

## 2024-10-10 DIAGNOSIS — R634 Abnormal weight loss: Secondary | ICD-10-CM

## 2024-10-10 DIAGNOSIS — R7303 Prediabetes: Secondary | ICD-10-CM

## 2024-10-10 DIAGNOSIS — Z122 Encounter for screening for malignant neoplasm of respiratory organs: Secondary | ICD-10-CM

## 2024-10-10 DIAGNOSIS — I2510 Atherosclerotic heart disease of native coronary artery without angina pectoris: Secondary | ICD-10-CM | POA: Diagnosis not present

## 2024-10-10 DIAGNOSIS — K529 Noninfective gastroenteritis and colitis, unspecified: Secondary | ICD-10-CM

## 2024-10-10 DIAGNOSIS — J3489 Other specified disorders of nose and nasal sinuses: Secondary | ICD-10-CM | POA: Diagnosis not present

## 2024-10-10 LAB — POCT GLYCOSYLATED HEMOGLOBIN (HGB A1C): HbA1c, POC (prediabetic range): 6 % (ref 5.7–6.4)

## 2024-10-10 MED ORDER — PRAVASTATIN SODIUM 10 MG PO TABS: ORAL_TABLET | ORAL | 2 refills | Status: AC

## 2024-10-10 MED ORDER — IPRATROPIUM BROMIDE 0.03 % NA SOLN: 1.0000 | Freq: Two times a day (BID) | NASAL | 0 refills | Status: AC | PRN

## 2024-10-10 NOTE — Progress Notes (Signed)
 "   Patient ID: Caleb Le, male    DOB: 21-Oct-1950  MRN: [id number]  CC: Medical Management of Chronic Issues   Subjective: Caleb Le is a 74 y.o. male who presents for chronic ds management. Wife is with him. His chronic medical issues include:  Patient with history of cigar smoker, skin cancer, HL, history of MI/ stent placed 2013, prediabetes   Discussed the use of AI scribe software for clinical note transcription with the patient, who gave verbal consent to proceed.  History of Present Illness Caleb Le is a 74 year old male who presents for follow-up of his chronic medical conditions.  CAD/HL: He has difficulty managing cholesterol due to intolerance to statins. Previously on Lipitor, he was switched to Crestor  but experienced significant muscle aches so he discontinued taking it. Wife also thingks it caused diarrhea for him.  Reports chronic diarrhea and constipation. Despite diarrhea, he takes Miralax  five times a week, stating that he experiences difficulty with having a bowel movements if not taken.  He has a history of hernia surgery and reports abdominal stiffness and hardness across mid section of abdomen. He has lost weight, dropping from 131 pounds in September to 124 pounds currently, attributing this to a lack of appetite, still eating only one meal a day, typically dinner. Similar issue addressed 08/2023 after which wgh had some what stabilized in the low 130s. He has reduced his intake of Monster energy drinks from five to six a day to one to one and a half a day. No excessive thirst is noted but urinates frequently about 10-15x/day and twice at nights despite cutting back on energy drinks. Hx of preDM.  He denies any problems swallowing, vomiting or abdominal pain.  No blood in the stools.  No blood in the urine.  He has had mild elevation in PSA for which he sees urology.  Reports last visit with them was about 6 months ago.  Endorses chronic cough which he  refers to as a smoker's cough.  He continues to smoke.  Referred for lung cancer screening with low-dose CT last year.  He was provided with the phone number for [facility] imaging on his last visit with me in September so that he can call them to schedule since he reports he never getting a call.  He has memory issues with abnormal mini cog and MMSE score of 23/30 on last visit.  Referred to neurology but reports he had to cancel the appointment because he had an appointment with the dermatologist on the same day.  His wife has rescheduled the appointment with his neurologist for March 3.    He reports a chronic runny nose, particularly in cold weather, with clear mucus and no sinus pain. 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    Patient Active Problem List   Diagnosis Date Noted   Tobacco dependence 12/23/2022   Inguinal hernia of left side without obstruction or gangrene 12/23/2022   Coronary artery disease involving native coronary artery of native heart without angina pectoris 12/23/2022   Constipation 12/23/2022   Pneumococcal vaccination declined 12/23/2022   Tetanus, diphtheria, and acellular pertussis (Tdap) vaccination declined 12/23/2022     Medications Ordered Prior to Encounter[1]  Allergies[2]  Social History   Socioeconomic History   Marital status: Divorced    Spouse name: Not on file   Number of children: Not on file   Years of education: Not on  file   Highest education level: Not on file  Occupational History   Not on file  Tobacco Use   Smoking status: Every Day    Current packs/day: 0.00    Types: Cigarettes, Cigars    Last attempt to quit: 2024    Years since quitting: 2.0   Smokeless tobacco: Never  Vaping Use   Vaping status: Never Used  Substance and Sexual Activity   Alcohol use: No    Comment: stopped drinking alcohol November 2013   Drug use: No   Sexual activity: Not on file  Other Topics Concern    Not on file  Social History Narrative   Not on file   Social Drivers of Health   Tobacco Use: High Risk (10/10/2024)   Patient History    Smoking Tobacco Use: Every Day    Smokeless Tobacco Use: Never    Passive Exposure: Not on file  Financial Resource Strain: Low Risk (12/30/2022)   Overall Financial Resource Strain (CARDIA)    Difficulty of Paying Living Expenses: Not hard at all  Food Insecurity: No Food Insecurity (07/09/2024)   Epic    Worried About Radiation Protection Practitioner of Food in the Last Year: Never true    Ran Out of Food in the Last Year: Never true  Transportation Needs: No Transportation Needs (07/09/2024)   Epic    Lack of Transportation (Medical): No    Lack of Transportation (Non-Medical): No  Physical Activity: Inactive (07/09/2024)   Exercise Vital Sign    Days of Exercise per Week: 0 days    Minutes of Exercise per Session: 0 min  Stress: No Stress Concern Present (07/09/2024)   [employer] of Occupational Health - Occupational Stress Questionnaire    Feeling of Stress: Not at all  Social Connections: Unknown (07/09/2024)   Social Connection and Isolation Panel    Frequency of Communication with Friends and Family: More than three times a week    Frequency of Social Gatherings with Friends and Family: Twice a week    Attends Religious Services: Never    Database Administrator or Organizations: No    Attends Banker Meetings: Never    Marital Status: Patient declined  Catering Manager Violence: Not At Risk (07/09/2024)   Epic    Fear of Current or Ex-Partner: No    Emotionally Abused: No    Physically Abused: No    Sexually Abused: No  Depression (PHQ2-9): Low Risk (07/09/2024)   Depression (PHQ2-9)    PHQ-2 Score: 0  Alcohol Screen: Low Risk (07/09/2024)   Alcohol Screen    Last Alcohol Screening Score (AUDIT): 0  Housing: Low Risk (07/09/2024)   Epic    Unable to Pay for Housing in the Last Year: No    Number of Times Moved in the Last  Year: 0    Homeless in the Last Year: No  Utilities: Not At Risk (07/09/2024)   Epic    Threatened with loss of utilities: No  Health Literacy: Adequate Health Literacy (07/09/2024)   B1300 Health Literacy    Frequency of need for help with medical instructions: Never    Family History  Problem Relation Age of Onset   Healthy Mother    Prostate cancer Father    Colon cancer Father    Diabetes Father    Colon polyps Neg Hx    Esophageal cancer Neg Hx    Rectal cancer Neg Hx    Stomach cancer Neg Hx  Past Surgical History:  Procedure Laterality Date   ANGIOPLASTY  2001   1 stent   KNEE ARTHROSCOPY  2009   right   skin cancer removal  2014   nose and ear    ROS: Review of Systems Negative except as stated above  PHYSICAL EXAM: BP 114/60 (BP Location: Left Arm, Patient Position: Sitting, Cuff Size: Normal)   Pulse (!) 58   Ht 5' 7 (1.702 m)   Wt 124 lb 3.2 oz (56.3 kg)   SpO2 97%   BMI 19.45 kg/m   Wt Readings from Last 3 Encounters:  10/10/24 124 lb 3.2 oz (56.3 kg)  07/09/24 132 lb 12.8 oz (60.2 kg)  06/10/24 131 lb (59.4 kg)    Physical Exam   General appearance - alert, well appearing, and in no distress Mental status - answers most questions appropriately but goes off in tangents and has to be redirected Nose -clear mucus in nostrils.  He has a hole in his nasal septum Mouth - mucous membranes moist, pharynx normal without lesions Neck - supple, no significant adenopathy Lymphatics - no palpable lymphadenopathy, no hepatosplenomegaly Chest - clear to auscultation, no wheezes, rales or rhonchi, symmetric air entry Heart - normal rate, regular rhythm, normal S1, S2, no murmurs, rubs, clicks or gallops Abdomen - soft, nontender, nondistended, no masses or organomegaly Extremities -no lower extremity edema  Results for orders placed or performed in visit on 10/10/24  POCT glycosylated hemoglobin (Hb A1C)   Collection Time: 10/10/24 12:48 PM  Result  Value Ref Range   Hemoglobin A1C     HbA1c POC (<> result, manual entry)     HbA1c, POC (prediabetic range) 6.0 5.7 - 6.4 %   HbA1c, POC (controlled diabetic range)         Latest Ref Rng & Units 06/12/2024   11:51 AM 08/29/2023   10:26 AM 12/23/2022   10:10 AM  CMP  Glucose 70 - 99 mg/dL 893  889  890   BUN 8 - 27 mg/dL 15  16  12    Creatinine 0.76 - 1.27 mg/dL 9.17  9.14  9.09   Sodium 134 - 144 mmol/L 139  139  137   Potassium 3.5 - 5.2 mmol/L 4.3  4.6  4.6   Chloride 96 - 106 mmol/L 101  101  99   CO2 20 - 29 mmol/L 21  23  23    Calcium  8.6 - 10.2 mg/dL 9.0  9.7  9.7   Total Protein 6.0 - 8.5 g/dL 6.8  7.1  7.0   Total Bilirubin 0.0 - 1.2 mg/dL 0.4  0.4  0.5   Alkaline Phos 47 - 123 IU/L 89  88  82   AST 0 - 40 IU/L 15  21  17    ALT 0 - 44 IU/L 11  13  17     Lipid Panel     Component Value Date/Time   CHOL 186 06/12/2024 1151   TRIG 61 06/12/2024 1151   HDL 50 06/12/2024 1151   CHOLHDL 3.7 06/12/2024 1151   LDLCALC 124 (H) 06/12/2024 1151    CBC    Component Value Date/Time   WBC 6.5 06/12/2024 1151   RBC 5.04 06/12/2024 1151   HGB 16.4 06/12/2024 1151   HCT 48.3 06/12/2024 1151   PLT 194 06/12/2024 1151   MCV 96 06/12/2024 1151   MCH 32.5 06/12/2024 1151   MCHC 34.0 06/12/2024 1151   RDW 13.7 06/12/2024 1151    ASSESSMENT AND  PLAN: 1. Coronary artery disease involving native coronary artery of native heart without angina pectoris (Primary) 2. Hyperlipidemia, unspecified hyperlipidemia type Patient has CAD and is asymptomatic.  He will continue aspirin.  He has not tolerated atorvastatin  or rosuvastatin .  He is willing to give 1 more statin therapy or try with pravastatin .  We will start him off taking it just 3 days a week to see if he tolerates it.  If he does not, the option would be to refer to cardiology for consideration of Repatha therapy. - pravastatin  (PRAVACHOL ) 10 MG tablet; 1 tab PO Q M/W/F  Dispense: 30 tablet; Refill: 2  3. Unintended weight  loss Part of his weight loss may be due to the fact that he has cut back significantly from drinking 6 monster drinks a day to just 1-1/2 cans a day.  However we still need to get him up-to-date with age-appropriate cancer screenings.  I have given him the phone number again for [facility] imaging and suggest that he calls to schedule the screening CAT scan of his chest.  I have firsthand experience of trying to get in contact with him on the call automatically goes to voicemail.  In terms of prostate cancer screening, he reports being up-to-date with following up with his urologist about 6 months ago.  He is up-to-date with colon cancer screening.  Last CBC done 4 months ago was normal.  We will get repeat CBC and thyroid  hormone level.  His close already for lunch.  He is agreeable to coming back to having the labs done early next week. -Encouraged him to eat at least 3 meals a day and avoid skipping meals. - TSH+T4F+T3Free; Future - CBC; Future  4. Memory changes Strongly advised to keep upcoming appointment with neurologist in March.  5. Tobacco dependence Strongly advised to discontinue smoking.  6. Screening for lung cancer Phone number for [facility] imaging provided on his discharge summary.  I showed him this information with the phone number that he should call to schedule the CAT scan of the lungs for lung cancer screening.  7. Chronic diarrhea I find it interesting that he complains of the diarrhea but he is using the MiraLAX  almost daily.  Advised to cut back on the MiraLAX .  Will refer to GI - Ambulatory referral to Gastroenterology  8. Prediabetes - Encouraged continued reduction of sugary drinks and increased water intake. - Advised on incorporating more fruits and vegetables into diet.  - POCT glycosylated hemoglobin (Hb A1C)  9. Rhinorrhea Clear nasal discharge exacerbated by cold weather. 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 - Prescribed Atrovent   nasal spray to use PRN.   Patient was given the opportunity to ask questions.  Patient verbalized understanding of the plan and was able to repeat key elements of the plan.   This documentation was completed using Paediatric nurse.  Any transcriptional errors are unintentional.  Orders Placed This Encounter  Procedures   TSH+T4F+T3Free   CBC   Ambulatory referral to Gastroenterology   POCT glycosylated hemoglobin (Hb A1C)     Requested Prescriptions   Signed Prescriptions Disp Refills   pravastatin  (PRAVACHOL ) 10 MG tablet 30 tablet 2    Sig: 1 tab PO Q M/W/F   ipratropium (ATROVENT ) 0.03 % nasal spray 30 mL 0    Sig: Place 1 spray into both nostrils 2 (two) times daily as needed for rhinitis.    Return in about 4 months (around 02/07/2025).  Barnie  Vicci, MD, FACP     [1]  Current Outpatient Medications on File Prior to Visit  Medication Sig Dispense Refill   aspirin 81 MG chewable tablet Chew 81 mg by mouth daily.       polyethylene glycol powder (GLYCOLAX /MIRALAX ) 17 GM/SCOOP powder Take 17 g by mouth daily as needed. 3350 g 1   sildenafil  (VIAGRA ) 50 MG tablet Take 1 tablet p.o. half hour to 1 hour prior to intercourse.  Limit use to 1 tab/24 hr period. 20 tablet 1   Varenicline  Tartrate, Starter, (CHANTIX  STARTING MONTH PAK) 0.5 MG X 11 & 1 MG X 42 TBPK Use as directed 53 each 0   No current facility-administered medications on file prior to visit.  [2] No Known Allergies  "

## 2024-10-10 NOTE — Patient Instructions (Addendum)
 [facility] - For imaging of lungs [address] [place], [place] [address] PH: [phone]   [facility]   [address]  [place], [place] [address]  Ph# [phone]   VISIT SUMMARY: During your visit, we discussed several ongoing health issues including chronic diarrhea, unintentional weight loss, coronary artery disease, hyperlipidemia with statin intolerance, prediabetes, memory impairment, nicotine dependence, and chronic rhinorrhea. We have made adjustments to your treatment plan and provided recommendations to help manage these conditions.  YOUR PLAN: -CHRONIC DIARRHEA: Chronic diarrhea can be caused by various factors, including medications like Miralax  or gastrointestinal issues post-hernia surgery. We have referred you to a gastroenterologist for further evaluation and advised you to try Imodium instead of Miralax .  -UNINTENTIONAL WEIGHT LOSS: Unintentional weight loss can be concerning and may be due to reduced caloric intake. We have ordered blood tests, including thyroid  function tests, and encouraged you to increase your caloric intake with balanced meals and snacks.  -CORONARY ARTERY DISEASE: Coronary artery disease is a condition where the blood vessels supplying the heart are narrowed or blocked. You should continue taking aspirin as prescribed to manage this condition.  -HYPERLIPIDEMIA WITH STATIN INTOLERANCE: Hyperlipidemia is high cholesterol, and statin intolerance means you have difficulty taking statin medications due to side effects. We have prescribed pravastatin  three times a week and may consider a referral to a cardiologist if you cannot tolerate pravastatin .  -PREDIABETES: Prediabetes means your blood sugar levels are higher than normal but not yet high enough to be classified as diabetes. We encourage you to continue reducing sugary drinks, increase water intake, and incorporate more fruits and vegetables into your diet.  -MEMORY  IMPAIRMENT: Memory impairment involves difficulty remembering things. Please ensure you attend your neurology appointment on March 3rd for further evaluation.  -NICOTINE DEPENDENCE: Nicotine dependence means you are addicted to smoking. We discussed the health risks associated with smoking and the importance of quitting.  -SCREENING FOR LUNG CANCER: Due to your smoking history, it is important to screen for lung cancer. Please ensure you complete the CT scan of your chest as ordered.  -CHRONIC RHINORRHEA: Chronic rhinorrhea is a condition where you have a persistent runny nose. We have prescribed Atrovent  nasal spray to help manage this symptom.  INSTRUCTIONS: Please follow up with the gastroenterologist for your chronic diarrhea, complete the blood tests for your weight loss, and attend your neurology appointment on March 3rd. Additionally, ensure you complete the CT scan for lung cancer screening as ordered.    Contains text generated by [employer]bridge.

## 2024-11-19 ENCOUNTER — Encounter: Payer: Self-pay | Admitting: Physician Assistant

## 2025-02-13 ENCOUNTER — Ambulatory Visit: Payer: Self-pay | Admitting: Internal Medicine
